# Patient Record
Sex: Male | Born: 1960 | Race: White | Hispanic: No | Smoking: Former smoker
Health system: Southern US, Academic
[De-identification: ages and names within clinical notes are randomized; demographics above are authoritative.]

## PROBLEM LIST (undated history)

## (undated) DIAGNOSIS — J449 Chronic obstructive pulmonary disease, unspecified: Secondary | ICD-10-CM

## (undated) DIAGNOSIS — E611 Iron deficiency: Secondary | ICD-10-CM

## (undated) DIAGNOSIS — E119 Type 2 diabetes mellitus without complications: Secondary | ICD-10-CM

## (undated) DIAGNOSIS — I10 Essential (primary) hypertension: Secondary | ICD-10-CM

## (undated) DIAGNOSIS — K4090 Unilateral inguinal hernia, without obstruction or gangrene, not specified as recurrent: Secondary | ICD-10-CM

## (undated) DIAGNOSIS — F32A Depression, unspecified: Secondary | ICD-10-CM

## (undated) DIAGNOSIS — D649 Anemia, unspecified: Secondary | ICD-10-CM

## (undated) DIAGNOSIS — L409 Psoriasis, unspecified: Secondary | ICD-10-CM

## (undated) DIAGNOSIS — J45909 Unspecified asthma, uncomplicated: Secondary | ICD-10-CM

## (undated) DIAGNOSIS — M5430 Sciatica, unspecified side: Secondary | ICD-10-CM

## (undated) DIAGNOSIS — F411 Generalized anxiety disorder: Secondary | ICD-10-CM

## (undated) HISTORY — PX: HX APPENDECTOMY: SHX54

## (undated) HISTORY — PX: DENTAL SURGERY: SHX609

## (undated) HISTORY — DX: Unilateral inguinal hernia, without obstruction or gangrene, not specified as recurrent: K40.90

## (undated) HISTORY — DX: Unspecified asthma, uncomplicated: J45.909

## (undated) HISTORY — DX: Anemia, unspecified: D64.9

## (undated) HISTORY — DX: Essential (primary) hypertension: I10

## (undated) HISTORY — DX: Type 2 diabetes mellitus without complications: E11.9

## (undated) HISTORY — DX: Psoriasis, unspecified: L40.9

## (undated) HISTORY — PX: HIP SURGERY: SHX245

## (undated) HISTORY — DX: Chronic obstructive pulmonary disease, unspecified: J44.9

## (undated) HISTORY — DX: Iron deficiency: E61.1

## (undated) HISTORY — DX: Generalized anxiety disorder: F41.1

## (undated) HISTORY — DX: Sciatica, unspecified side: M54.30

## (undated) HISTORY — DX: Depression, unspecified: F32.A

---

## 2011-03-04 ENCOUNTER — Other Ambulatory Visit (HOSPITAL_COMMUNITY): Payer: Self-pay | Admitting: Emergency Medicine

## 2022-02-08 ENCOUNTER — Ambulatory Visit (HOSPITAL_COMMUNITY): Payer: Self-pay | Admitting: NURSE PRACTITIONER

## 2022-02-08 NOTE — Telephone Encounter (Signed)
-----   Message from [facility] sent at 02/07/2022  4:33 PM EDT -----  New referral pt     Crystal is calling from [facility] needing to speak to someone about a referral that was sent last month, if we can please call her back.      Thank you

## 2022-02-08 NOTE — Telephone Encounter (Signed)
Called and notified that we have received the referral paperwork and will hopefully let them know this week about an appointment.     Patrcia Dolly APRN, FNP-BC, AOCNP, 02/08/2022 , 13:36

## 2022-04-10 ENCOUNTER — Ambulatory Visit (HOSPITAL_COMMUNITY): Payer: Medicaid Other | Admitting: NURSE PRACTITIONER

## 2022-06-08 ENCOUNTER — Other Ambulatory Visit: Payer: Self-pay

## 2022-06-08 ENCOUNTER — Encounter (HOSPITAL_COMMUNITY): Payer: Self-pay | Admitting: NURSE PRACTITIONER

## 2022-06-08 ENCOUNTER — Ambulatory Visit: Payer: Medicaid Other | Attending: NURSE PRACTITIONER | Admitting: NURSE PRACTITIONER

## 2022-06-08 ENCOUNTER — Ambulatory Visit (HOSPITAL_COMMUNITY): Payer: Medicaid Other

## 2022-06-08 ENCOUNTER — Other Ambulatory Visit (HOSPITAL_COMMUNITY): Payer: Self-pay | Admitting: NURSE PRACTITIONER

## 2022-06-08 VITALS — BP 131/83 | HR 128 | Temp 97.6°F | Ht 66.0 in | Wt 190.5 lb

## 2022-06-08 DIAGNOSIS — D509 Iron deficiency anemia, unspecified: Secondary | ICD-10-CM

## 2022-06-08 DIAGNOSIS — R5383 Other fatigue: Secondary | ICD-10-CM | POA: Insufficient documentation

## 2022-06-08 DIAGNOSIS — M549 Dorsalgia, unspecified: Secondary | ICD-10-CM | POA: Insufficient documentation

## 2022-06-08 DIAGNOSIS — M2550 Pain in unspecified joint: Secondary | ICD-10-CM | POA: Insufficient documentation

## 2022-06-08 DIAGNOSIS — R10811 Right upper quadrant abdominal tenderness: Secondary | ICD-10-CM | POA: Insufficient documentation

## 2022-06-08 DIAGNOSIS — R0602 Shortness of breath: Secondary | ICD-10-CM | POA: Insufficient documentation

## 2022-06-08 DIAGNOSIS — R42 Dizziness and giddiness: Secondary | ICD-10-CM | POA: Insufficient documentation

## 2022-06-08 DIAGNOSIS — F32A Depression, unspecified: Secondary | ICD-10-CM | POA: Insufficient documentation

## 2022-06-08 DIAGNOSIS — F17210 Nicotine dependence, cigarettes, uncomplicated: Secondary | ICD-10-CM | POA: Insufficient documentation

## 2022-06-08 DIAGNOSIS — R450 Nervousness: Secondary | ICD-10-CM | POA: Insufficient documentation

## 2022-06-08 LAB — COMPREHENSIVE METABOLIC PANEL, NON-FASTING
ALBUMIN/GLOBULIN RATIO: 1.6 — ABNORMAL HIGH (ref 0.8–1.4)
ALBUMIN: 4.3 g/dL (ref 3.5–5.7)
ALKALINE PHOSPHATASE: 93 U/L (ref 34–104)
ALT (SGPT): 10 U/L (ref 7–52)
ANION GAP: 8 mmol/L (ref 4–13)
AST (SGOT): 14 U/L (ref 13–39)
BILIRUBIN TOTAL: 0.4 mg/dL (ref 0.3–1.2)
BUN/CREA RATIO: 11 (ref 6–22)
BUN: 14 mg/dL (ref 7–25)
CALCIUM, CORRECTED: 9.5 mg/dL (ref 8.9–10.8)
CALCIUM: 9.7 mg/dL (ref 8.6–10.3)
CHLORIDE: 101 mmol/L (ref 98–107)
CO2 TOTAL: 28 mmol/L (ref 21–31)
CREATININE: 1.23 mg/dL (ref 0.60–1.30)
ESTIMATED GFR: 68 mL/min/{1.73_m2} (ref >59)
GLOBULIN: 2.7 — ABNORMAL LOW (ref 2.9–5.4)
GLUCOSE: 232 mg/dL — ABNORMAL HIGH (ref 74–109)
OSMOLALITY, CALCULATED: 282 mOsm/kg (ref 270–290)
POTASSIUM: 4 mmol/L (ref 3.5–5.1)
PROTEIN TOTAL: 7 g/dL (ref 6.4–8.9)
SODIUM: 137 mmol/L (ref 136–145)

## 2022-06-08 LAB — IRON TRANSFERRIN AND TIBC
IRON (TRANSFERRIN) SATURATION: 56 % — ABNORMAL HIGH (ref 20–50)
IRON: 180 ug/dL (ref 50–212)
TOTAL IRON BINDING CAPACITY: 322 ug/dL (ref 250–450)
TRANSFERRIN: 230 mg/dL (ref 203–362)
UIBC: 142 ug/dL (ref 130–375)

## 2022-06-08 LAB — FERRITIN: FERRITIN: 52 ng/mL (ref 11–336)

## 2022-06-08 LAB — CBC WITH DIFF
BASOPHIL #: 0.1 10*3/uL (ref 0.00–0.10)
BASOPHIL %: 1 % (ref 0–1)
EOSINOPHIL #: 0.8 10*3/uL — ABNORMAL HIGH (ref 0.00–0.50)
EOSINOPHIL %: 8 %
HCT: 42.7 % (ref 36.7–47.1)
HGB: 13.7 g/dL (ref 12.5–16.3)
LYMPHOCYTE #: 1.9 10*3/uL (ref 1.00–3.00)
LYMPHOCYTE %: 18 % (ref 16–44)
MCH: 24.9 pg (ref 23.8–33.4)
MCHC: 32 g/dL — ABNORMAL LOW (ref 32.5–36.3)
MCV: 77.6 fL (ref 73.0–96.2)
MONOCYTE #: 0.8 10*3/uL (ref 0.30–1.00)
MONOCYTE %: 7 % (ref 5–13)
MPV: 7.1 fL — ABNORMAL LOW (ref 7.4–11.4)
NEUTROPHIL #: 6.9 10*3/uL (ref 1.85–7.80)
NEUTROPHIL %: 66 % (ref 43–77)
PLATELETS: 288 10*3/uL (ref 140–440)
RBC: 5.51 10*6/uL (ref 4.06–5.63)
RDW: 15.6 % (ref 12.1–16.2)
WBC: 10.6 10*3/uL — ABNORMAL HIGH (ref 3.6–10.2)

## 2022-06-08 NOTE — H&P (Signed)
Department of Hematology/Oncology  History and Physical    Name: Nathan Hopkins  [id number]  Date of Birth: 22-Aug-1960  Encounter Date: 06/08/2022    REFERRING PROVIDER:  Hul[place] [address]    REASON FOR OFFICE VISIT:  New patient for evaluation and management of  iron deficiency anemia    HISTORY OF PRESENT ILLNESS:  Nathan Hopkins is a 62 y.o. male who presents today for iron deficiency anemia. He complains mostly of fatigue. Denies any PICA. He has been taking oral iron daily for several months without much response (See ROS for additional complaints)    ROS:   Review of Systems   Constitutional:  Positive for fatigue. Negative for appetite change.   HENT:  Negative.  Negative for trouble swallowing.    Eyes: Negative.    Respiratory:  Positive for shortness of breath (smokes 1 PPD but trying to quit).    Cardiovascular: Negative.  Negative for chest pain.   Gastrointestinal: Negative.  Negative for abdominal pain, constipation, diarrhea, nausea and vomiting.   Genitourinary: Negative.  Negative for difficulty urinating.    Musculoskeletal:  Positive for arthralgias and back pain.   Skin: Negative.    Neurological:  Positive for light-headedness (when standing to fast).   Hematological:  Bruises/bleeds easily.   Psychiatric/Behavioral:  Positive for depression. The patient is nervous/anxious.         History:  Past Medical History:   Diagnosis Date    Anemia     Anxiety state     COPD (chronic obstructive pulmonary disease) (CMS HCC)     Depression     Diabetes mellitus, type 2 (CMS HCC)     Essential hypertension     Inguinal hernia     Iron deficiency     Psoriasis and similar disorders     ears and back of head           Past Surgical History:   Procedure Laterality Date    DENTAL SURGERY      HIP SURGERY      artifical hip    HX APPENDECTOMY             Social History     Socioeconomic History    Marital status: Divorced     Spouse name: Not on file     Number of children: Not on file    Years of education: Not on file    Highest education level: Not on file   Occupational History    Not on file   Tobacco Use    Smoking status: Every Day     Types: Cigarettes    Smokeless tobacco: Never   Vaping Use    Vaping Use: Former   Substance and Sexual Activity    Alcohol use: Yes    Drug use: Never    Sexual activity: Not on file   Other Topics Concern    Not on file   Social History Narrative    Not on file     Social Determinants of Health     Financial Resource Strain: Not on file   Transportation Needs: Not on file   Social Connections: Not on file   Intimate Partner Violence: Not on file   Housing Stability: Not on file       Social History     Social History Narrative    Not on file       Social History  Substance and Sexual Activity   Drug Use Never       Family Medical History:       Problem Relation (Age of Onset)    Alzheimer's/Dementia Mother    Lung Cancer Father              Current Outpatient Medications   Medication Sig    ACCU-CHEK GUIDE TEST STRIPS Does not apply Strip     ACCU-CHEK SOFTCLIX LANCETS Misc Use to test blood sugar fingerstick Three times a day fingerstick Three times a day for 90 days    albuterol sulfate (PROVENTIL OR VENTOLIN OR PROAIR) 90 mcg/actuation Inhalation oral inhaler Take 1 Puff by inhalation    albuterol sulfate (PROVENTIL OR VENTOLIN OR PROAIR) 90 mcg/actuation Inhalation oral inhaler 1 puff as needed Inhalation every 4 hrs for 90 days    ANORO ELLIPTA 62.5-25 mcg/actuation Inhalation oral diskus inhaler     Benzonatate (TESSALON) 200 mg Oral Capsule 1 Capsule (200 mg total)    busPIRone (BUSPAR) 15 mg Oral Tablet     celecoxib (CELEBREX) 200 mg Oral Capsule 1 capsule with food Orally 2 times a day for 90 days    clonazePAM (KLONOPIN) 1 mg Oral Tablet 1 Tablet (1 mg total)    CYMBALTA 60 mg Oral Capsule, Delayed Release(E.C.) 2 cap(s) orally once a day    hydrOXYzine pamoate (VISTARIL) 100 mg Oral Capsule as directed 3 times  a day Orally three times a day    ipratropium-albuteroL (COMBIVENT RESPIMAT) 20-100 mcg/actuation Inhalation Mist Take 1 Puff by inhalation    ipratropium-albuterol 0.5 mg-3 mg(2.5 mg base)/3 mL Solution for Nebulization 3 ml by nebulizer 4 times a day for 90 days    Levocetirizine (XYZAL) 5 mg Oral Tablet 1 tablet in the evening Orally Once a day for 90 days    loratadine (CLARITIN) 10 mg Oral Tablet Take 1 Tablet (10 mg total) by mouth    MetFORMIN (GLUCOPHAGE) 1,000 mg Oral Tablet 1 tablet with a meal Orally 2 times a day for 90 days    Mirtazapine (REMERON) 45 mg Oral Tablet     montelukast (SINGULAIR) 10 mg Oral Tablet 1 Tablet (10 mg total)    nicotine (NICODERM CQ) 21 mg/24 hr Transdermal Patch 24 hr Place 1 Patch (21 mg total) on the skin    polysaccharide iron complex (FERREX 150) 150 mg iron Oral Capsule     prazosin (MINIPRESS) 2 mg Oral Capsule     PROTONIX 40 mg Oral Tablet, Delayed Release (E.C.) 1 tab(s) orally once a day for 90 days    rOPINIRole (REQUIP) 1 mg Oral Tablet 1 tab(s) orally 2 times a day for 90 days    SYMBICORT 160-4.5 mcg/actuation Inhalation oral inhaler 2 puffs Inhalation Twice a day for 90 days    tamsulosin (FLOMAX) 0.4 mg Oral Capsule 1 cap(s) orally once a day for 90 days    umeclidinium-vilanteroL (ANORO ELLIPTA) 62.5-25 mcg/actuation Inhalation oral diskus inhaler Take by inhalation Once a day    varenicline (CHANTIX) dose pak        No Known Allergies      PHYSICAL EXAM:  BP 131/83 (Site: Left, Patient Position: Sitting, Cuff Size: Adult)   Pulse (!) 128   Temp 36.4 C (97.6 F) (Temporal)   Ht 1.676 m (5' 6"$ )   Wt 86.4 kg (190 lb 8 oz)   BMI 30.75 kg/m        ECOG Status: (1) Restricted in physically strenuous activity, ambulatory and  able to do work of light nature   Physical Exam  Vitals and nursing note reviewed.   Constitutional:       Appearance: Normal appearance.   HENT:      Head: Normocephalic.      Nose: Nose normal.      Mouth/Throat:      Mouth: Mucous  membranes are moist.      Pharynx: Oropharynx is clear.   Eyes:      General: No scleral icterus.     Extraocular Movements: Extraocular movements intact.   Cardiovascular:      Rate and Rhythm: Normal rate and regular rhythm.      Pulses: Normal pulses.      Heart sounds: Normal heart sounds.   Pulmonary:      Effort: Pulmonary effort is normal.      Breath sounds: Normal breath sounds.   Abdominal:      General: Bowel sounds are normal.      Palpations: Abdomen is soft.      Tenderness: There is abdominal tenderness (RUQ with palpation).   Musculoskeletal:         General: Normal range of motion.      Cervical back: Normal range of motion and neck supple.   Skin:     General: Skin is warm and dry.   Neurological:      General: No focal deficit present.      Mental Status: He is alert and oriented to person, place, and time. Mental status is at baseline.   Psychiatric:         Mood and Affect: Mood normal.         Behavior: Behavior normal.         Thought Content: Thought content normal.         Judgment: Judgment normal.       LABS:                             ASSESSMENT:    ICD-10-CM    1. Iron deficiency anemia  D50.9 COMPREHENSIVE METABOLIC PANEL, NON-FASTING     CBC/DIFF     FERRITIN     IRON TRANSFERRIN AND TIBC             PLAN:   1. All relative external and internal medical records were reviewed including available H&Ps, progress notes, procedure notes, imaging's, laboratories, and pathology.   2. Referral and all pertinent labs reviewed. Details of exam finding's discussed.   3. Patient will have labs completed as ordered. IV iron will be ordered for patient. We discussed the process of obtaining insurance authorization and he will be notified once he is scheduled a time and date to return for treatment once this process is completed. The patient is agreeable to this plan.       Vondell Kisler was given the chance to ask questions, and these were answered to their satisfaction. The patient is welcome  to call with any questions or concerns in the meantime.     On the day of the encounter, a total of  54 minutes was spent on this patient encounter including review of historical information, examination, documentation and post-visit activities.   Return in about 6 weeks (around 07/20/2022).     Kathrin Penner, FNP-BC  06/08/2022, 10:28    CC:  Hulan Saas, APRN  [place] [address]    Newsome-Deel, Joellen [place] [address]      This note was partially generated using MModal Fluency Direct system, and there may be some incorrect words, spellings, and punctuation that were not noted in checking the note before saving.

## 2022-07-02 NOTE — ED Triage Notes (Signed)
Patient to ER with c/o cough, dizziness and SOB. Patient states that he is always SOB but that today it was worse. Patient states sat was 80% at home on room air. Patient is suppose to wear o2 at home.

## 2022-07-19 ENCOUNTER — Ambulatory Visit (HOSPITAL_COMMUNITY): Payer: Self-pay | Admitting: NURSE PRACTITIONER

## 2022-07-26 ENCOUNTER — Ambulatory Visit: Payer: Medicaid Other | Attending: NURSE PRACTITIONER | Admitting: NURSE PRACTITIONER

## 2022-07-26 ENCOUNTER — Encounter (HOSPITAL_COMMUNITY): Payer: Self-pay | Admitting: NURSE PRACTITIONER

## 2022-07-26 ENCOUNTER — Other Ambulatory Visit: Payer: Self-pay

## 2022-07-26 VITALS — BP 133/73 | HR 123 | Temp 98.0°F | Wt 194.1 lb

## 2022-07-26 DIAGNOSIS — R5383 Other fatigue: Secondary | ICD-10-CM | POA: Insufficient documentation

## 2022-07-26 DIAGNOSIS — D509 Iron deficiency anemia, unspecified: Secondary | ICD-10-CM | POA: Insufficient documentation

## 2022-07-26 DIAGNOSIS — F17200 Nicotine dependence, unspecified, uncomplicated: Secondary | ICD-10-CM | POA: Insufficient documentation

## 2022-07-26 DIAGNOSIS — J45909 Unspecified asthma, uncomplicated: Secondary | ICD-10-CM | POA: Insufficient documentation

## 2022-07-26 NOTE — Cancer Center Note (Signed)
Department of Hematology/Oncology  History and Physical    Name: Emigdio Duty  IOX:[id number]  Date of Birth: April 04, 1961  Encounter Date: 07/26/2022    REFERRING PROVIDER:  Jeri Modena, APRN  [address]  [place],  [place] [address]    REASON FOR OFFICE VISIT:  Follow up for evaluation and management of  iron deficiency anemia    HISTORY OF PRESENT ILLNESS:  Connie Bison is a 62 y.o. male who presents today for iron deficiency anemia. He complains mostly of fatigue. Denies any PICA. He has been taking oral iron daily for several months without much response.     07/26/22: We discussed that his lab work at his last visit showed his iron back to normal limits. We discussed that we could let him follow up with his PCP for his labs and he could return on an as needed basis since the oral iron is working. He states that he does have breathing issues related to his asthma and smoking history and is still working on quitting.     ROS:   Review of Systems   Constitutional:  Positive for fatigue. Negative for appetite change.   HENT:  Negative.  Negative for trouble swallowing.    Eyes: Negative.    Respiratory:  Positive for shortness of breath (smokes 1 PPD but trying to quit).    Cardiovascular: Negative.  Negative for chest pain.   Gastrointestinal: Negative.  Negative for abdominal pain, constipation, diarrhea, nausea and vomiting.   Genitourinary: Negative.  Negative for difficulty urinating.    Musculoskeletal:  Positive for arthralgias and back pain.   Skin: Negative.    Neurological:  Positive for light-headedness (when standing to fast).   Hematological:  Bruises/bleeds easily.   Psychiatric/Behavioral:  Positive for depression. The patient is nervous/anxious.         History:  Past Medical History:   Diagnosis Date    Anemia     Anxiety state     COPD (chronic obstructive pulmonary disease) (CMS HCC)     Depression     Diabetes mellitus, type 2 (CMS HCC)     Essential hypertension     Inguinal hernia      Iron deficiency     Psoriasis and similar disorders     ears and back of head     Past Surgical History:   Procedure Laterality Date    DENTAL SURGERY      HIP SURGERY      artifical hip    HX APPENDECTOMY       Social History     Socioeconomic History    Marital status: Divorced     Spouse name: Not on file    Number of children: Not on file    Years of education: Not on file    Highest education level: Not on file   Occupational History    Not on file   Tobacco Use    Smoking status: Every Day     Types: Cigarettes    Smokeless tobacco: Never   Vaping Use    Vaping status: Former   Substance and Sexual Activity    Alcohol use: Yes    Drug use: Never    Sexual activity: Not on file   Other Topics Concern    Not on file   Social History Narrative    Not on file     Social Determinants of Health     Financial Resource Strain: Not on file  Transportation Needs: Not on file   Social Connections: Not on file   Intimate Partner Violence: Not on file   Housing Stability: Not on file     Social History     Social History Narrative    Not on file       Social History     Substance and Sexual Activity   Drug Use Never       Family Medical History:       Problem Relation (Age of Onset)    Alzheimer's/Dementia Mother    Lung Cancer Father          Current Outpatient Medications   Medication Sig    ACCU-CHEK GUIDE TEST STRIPS Does not apply Strip     ACCU-CHEK SOFTCLIX LANCETS Misc Use to test blood sugar fingerstick Three times a day fingerstick Three times a day for 90 days    albuterol sulfate (PROVENTIL OR VENTOLIN OR PROAIR) 90 mcg/actuation Inhalation oral inhaler Take 1 Puff by inhalation    albuterol sulfate (PROVENTIL OR VENTOLIN OR PROAIR) 90 mcg/actuation Inhalation oral inhaler 1 puff as needed Inhalation every 4 hrs for 90 days    ANORO ELLIPTA 62.5-25 mcg/actuation Inhalation oral diskus inhaler     Benzonatate (TESSALON) 200 mg Oral Capsule 1 Capsule (200 mg total)    busPIRone (BUSPAR) 15 mg Oral Tablet      celecoxib (CELEBREX) 200 mg Oral Capsule 1 capsule with food Orally 2 times a day for 90 days    clonazePAM (KLONOPIN) 1 mg Oral Tablet 1 Tablet (1 mg total)    CYMBALTA 60 mg Oral Capsule, Delayed Release(E.C.) 2 cap(s) orally once a day    hydrOXYzine pamoate (VISTARIL) 100 mg Oral Capsule as directed 3 times a day Orally three times a day    ipratropium-albuteroL (COMBIVENT RESPIMAT) 20-100 mcg/actuation Inhalation Mist Take 1 Puff by inhalation    ipratropium-albuterol 0.5 mg-3 mg(2.5 mg base)/3 mL Solution for Nebulization 3 ml by nebulizer 4 times a day for 90 days    Levocetirizine (XYZAL) 5 mg Oral Tablet 1 tablet in the evening Orally Once a day for 90 days    loratadine (CLARITIN) 10 mg Oral Tablet Take 1 Tablet (10 mg total) by mouth    MetFORMIN (GLUCOPHAGE) 1,000 mg Oral Tablet 1 tablet with a meal Orally 2 times a day for 90 days    Mirtazapine (REMERON) 45 mg Oral Tablet     montelukast (SINGULAIR) 10 mg Oral Tablet 1 Tablet (10 mg total)    nicotine (NICODERM CQ) 21 mg/24 hr Transdermal Patch 24 hr Place 1 Patch (21 mg total) on the skin    polysaccharide iron complex (FERREX 150) 150 mg iron Oral Capsule     prazosin (MINIPRESS) 2 mg Oral Capsule     PROTONIX 40 mg Oral Tablet, Delayed Release (E.C.) 1 tab(s) orally once a day for 90 days    rOPINIRole (REQUIP) 1 mg Oral Tablet 1 tab(s) orally 2 times a day for 90 days    SYMBICORT 160-4.5 mcg/actuation Inhalation oral inhaler 2 puffs Inhalation Twice a day for 90 days    tamsulosin (FLOMAX) 0.4 mg Oral Capsule 1 cap(s) orally once a day for 90 days    umeclidinium-vilanteroL (ANORO ELLIPTA) 62.5-25 mcg/actuation Inhalation oral diskus inhaler Take by inhalation Once a day    varenicline (CHANTIX) dose pak        No Known Allergies      PHYSICAL EXAM:  BP 133/73 (Site: Left, Patient Position: Sitting,  Cuff Size: Adult)   Pulse (!) 123   Temp 36.7 C (98 F) (Thermal Scan)   Wt 88 kg (194 lb 1.6 oz)   SpO2 96%   BMI 31.33 kg/m        ECOG  Status: (1) Restricted in physically strenuous activity, ambulatory and able to do work of light nature   Physical Exam  Vitals and nursing note reviewed.   Constitutional:       Appearance: Normal appearance.   HENT:      Head: Normocephalic.      Nose: Nose normal.      Mouth/Throat:      Mouth: Mucous membranes are moist.      Pharynx: Oropharynx is clear.   Eyes:      General: No scleral icterus.     Extraocular Movements: Extraocular movements intact.   Cardiovascular:      Rate and Rhythm: Normal rate and regular rhythm.      Pulses: Normal pulses.      Heart sounds: Normal heart sounds.   Pulmonary:      Effort: Pulmonary effort is normal.      Breath sounds: Normal breath sounds.   Abdominal:      General: Bowel sounds are normal.      Palpations: Abdomen is soft.      Tenderness: There is abdominal tenderness (RUQ with palpation).   Musculoskeletal:         General: Normal range of motion.      Cervical back: Normal range of motion and neck supple.   Skin:     General: Skin is warm and dry.   Neurological:      General: No focal deficit present.      Mental Status: He is alert and oriented to person, place, and time. Mental status is at baseline.   Psychiatric:         Mood and Affect: Mood normal.         Behavior: Behavior normal.         Thought Content: Thought content normal.         Judgment: Judgment normal.       LABS:     CBC  Diff   Lab Results   Component Value Date/Time    WBC 10.6 (H) 06/08/2022 11:07 AM    HGB 13.7 06/08/2022 11:07 AM    HCT 42.7 06/08/2022 11:07 AM    PLTCNT 288 06/08/2022 11:07 AM    RBC 5.51 06/08/2022 11:07 AM    MCV 77.6 06/08/2022 11:07 AM    MCHC 32.0 (L) 06/08/2022 11:07 AM    MCH 24.9 06/08/2022 11:07 AM    RDW 15.6 06/08/2022 11:07 AM    MPV 7.1 (L) 06/08/2022 11:07 AM    Lab Results   Component Value Date/Time    PMNS 66 06/08/2022 11:07 AM    LYMPHOCYTES 18 06/08/2022 11:07 AM    EOSINOPHIL 8 06/08/2022 11:07 AM    MONOCYTES 7 06/08/2022 11:07 AM    BASOPHILS 1  06/08/2022 11:07 AM    BASOPHILS 0.10 06/08/2022 11:07 AM    PMNABS 6.90 06/08/2022 11:07 AM    LYMPHSABS 1.90 06/08/2022 11:07 AM    EOSABS 0.80 (H) 06/08/2022 11:07 AM    MONOSABS 0.80 06/08/2022 11:07 AM            Comprehensive Metabolic Profile    Lab Results   Component Value Date    SODIUM 137 06/08/2022    POTASSIUM 4.0  06/08/2022    CHLORIDE 101 06/08/2022    CO2 28 06/08/2022    ANIONGAP 8 06/08/2022    BUN 14 06/08/2022    CREATININE 1.23 06/08/2022    ALBUMIN 4.3 06/08/2022    CALCIUM 9.7 06/08/2022    GLUCOSENF 232 (H) 06/08/2022    ALKPHOS 93 06/08/2022    ALT 10 06/08/2022    AST 14 06/08/2022    TOTBILIRUBIN 0.4 06/08/2022    TOTALPROTEIN 7.0 06/08/2022       IRON   Date Value Ref Range Status   06/08/2022 180 50 - 212 ug/dL Final     FERRITIN   Date Value Ref Range Status   06/08/2022 52 11 - 336 ng/mL Final     TOTAL IRON BINDING CAPACITY   Date Value Ref Range Status   06/08/2022 322 250 - 450 ug/dL Final     UIBC   Date Value Ref Range Status   06/08/2022 142 130 - 375 ug/dL Final     IRON (TRANSFERRIN) SATURATION   Date Value Ref Range Status   06/08/2022 56 (H) 20 - 50 % Final       ASSESSMENT:    ICD-10-CM    1. Iron deficiency anemia  D50.9            PLAN:   He will continue his current medications as ordered including his oral iron. He will follow up with his PCP as scheduled and continue to have his labs monitored with them. He will notify our office if he would like to return for follow up or his PCP would like him to return. He is agreeable to this plan.     Kadeem Radene KneeStilwell was given the chance to ask questions, and these were answered to their satisfaction. The patient is welcome to call with any questions or concerns in the meantime.     On the day of the encounter, a total of  36 minutes was spent on this patient encounter including review of historical information, examination, documentation and post-visit activities.   Return if symptoms worsen or fail to improve.     Benjaman PottMelissa  Alyra Patty, APRN,FNP-BC ,07/26/2022 ,15:43     CC:  Jeri ModenaKatie Newsome-Deel, APRN  [address]  [place] [id number]    Newsome-Deel, Katie, APRN  [address]  [place] [phone]      This note was partially generated using MModal Fluency Direct system, and there may be some incorrect words, spellings, and punctuation that were not noted in checking the note before saving.

## 2023-05-09 IMAGING — MR MRI BRAIN W/O CONTRAST
9 series · 48 of 48 positions shown · non-contrast
Comparison: None available.

﻿EXAM:  MRI BRAIN W/O CONTRAST
INDICATION: 62-year-old male with history of dizziness and disorientation. No history of trauma, malignancy.
TECHNIQUE: Multiplanar, multisequential MRI of the brain was performed without administration of IV contrast.

[Series 5: DWI · axial · 6.5mm · 1.35mm/px · z∈[-101,+56]mm · 16 of 88 slices shown (1 of 3)]
[im 1/88]
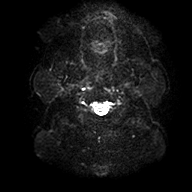
[im 6/88]
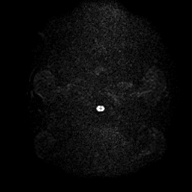
[im 12/88]
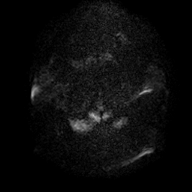
[im 18/88]
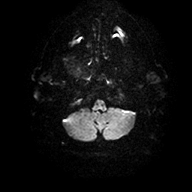
[im 24/88]
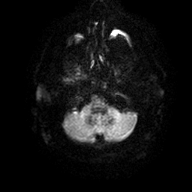
[im 30/88]
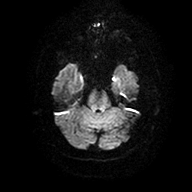
[im 35/88]
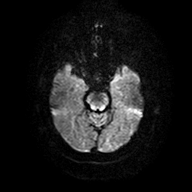
[im 41/88]
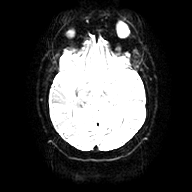
[im 47/88]
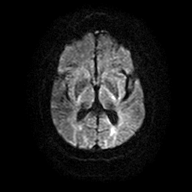
[im 53/88]
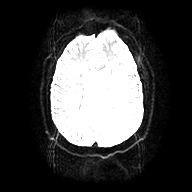
[im 59/88]
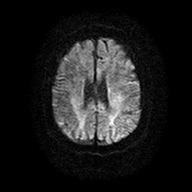
[im 64/88]
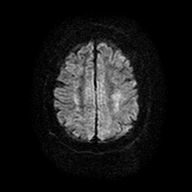
[im 70/88]
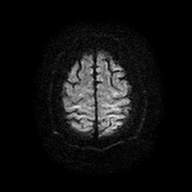
[im 76/88]
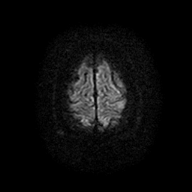
[im 82/88]
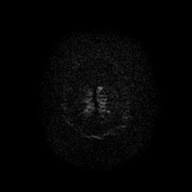
[im 88/88]
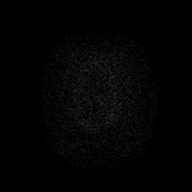

[Series 6: DWI · axial · 6.5mm · 1.35mm/px · z∈[-101,+56]mm · 4 of 22 slices shown (2 of 3)]
[im 1/22]
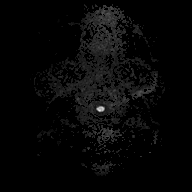
[im 8/22]
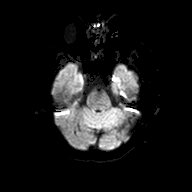
[im 15/22]
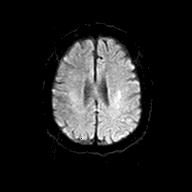
[im 22/22]
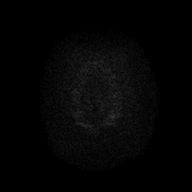

[Series 7: DWI · axial · 6.5mm · 1.35mm/px · z∈[-101,+56]mm · 4 of 22 slices shown (3 of 3)]
[im 1/22]
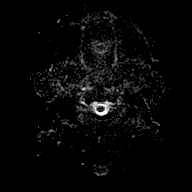
[im 8/22]
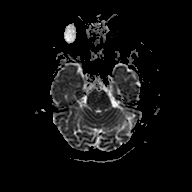
[im 15/22]
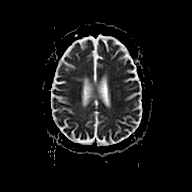
[im 22/22]
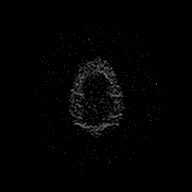

[Series 8: FLAIR · sagittal · 5.0mm · 0.75mm/px · 4 of 26 slices shown (1 of 2)]
[im 1/26]
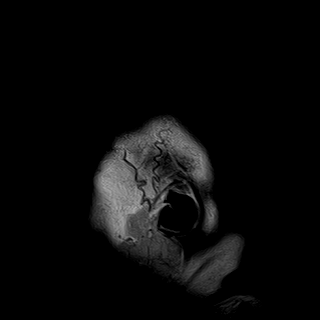
[im 9/26]
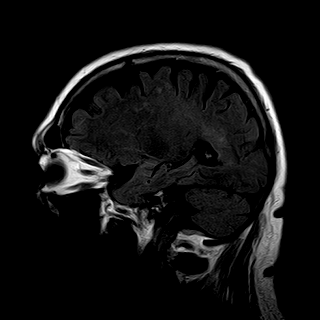
[im 17/26]
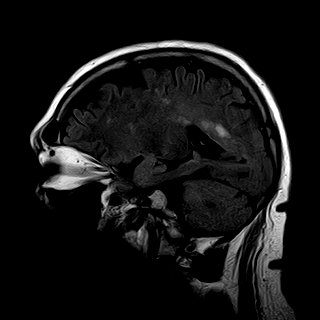
[im 26/26]
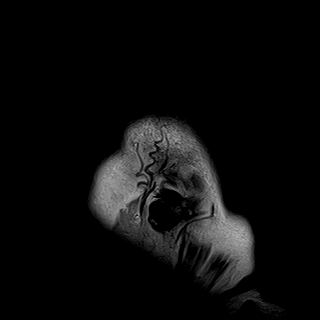

[Series 9: T2 · axial · 5.5mm · 0.43mm/px · z∈[-97,+59]mm · 4 of 25 slices shown (1 of 2)]
[im 1/25]
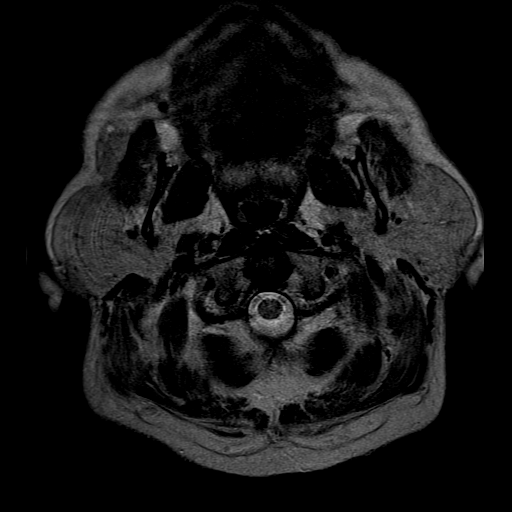
[im 9/25]
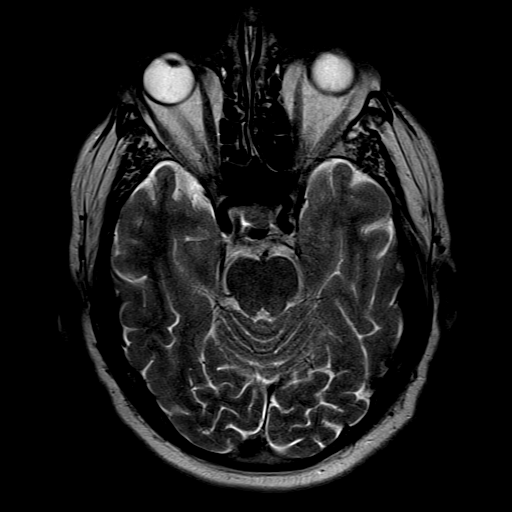
[im 17/25]
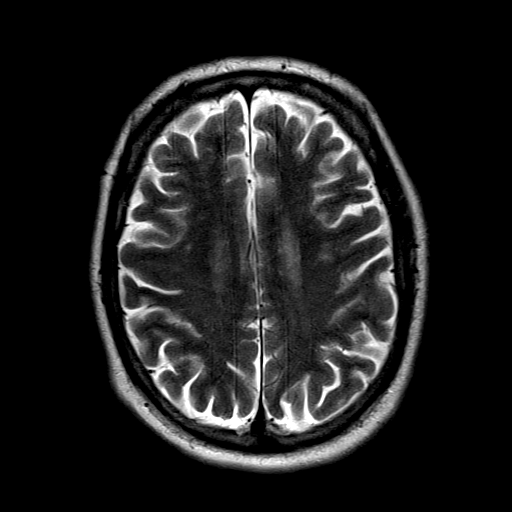
[im 25/25]
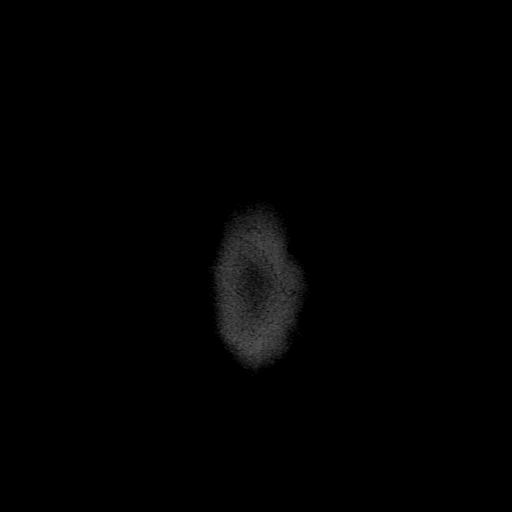

[Series 10: T2 · coronal · 6.5mm · 0.43mm/px · 4 of 26 slices shown (2 of 2)]
[im 1/26]
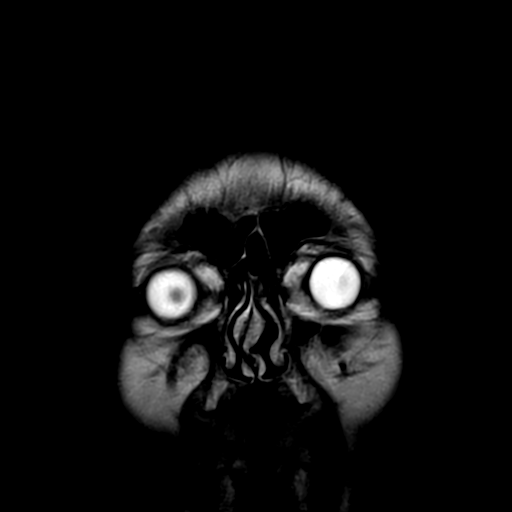
[im 9/26]
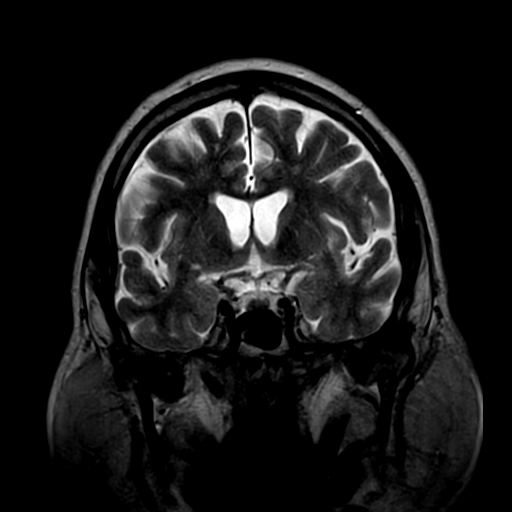
[im 17/26]
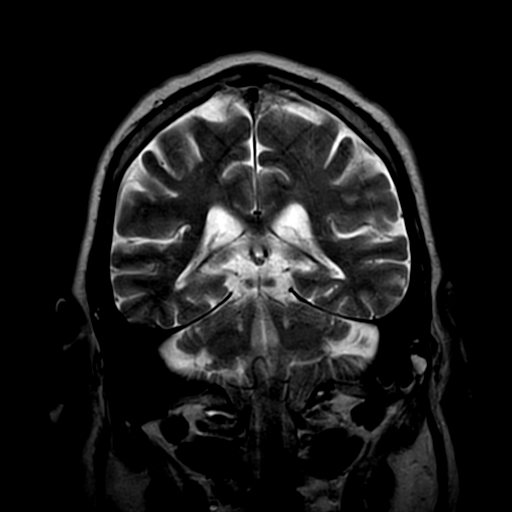
[im 26/26]
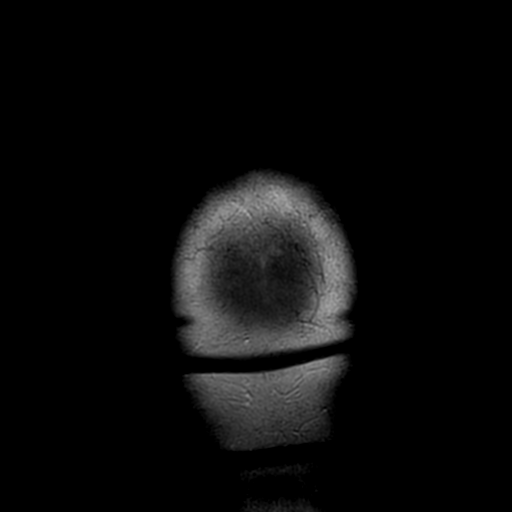

[Series 11: FLAIR · axial · 5.5mm · 0.76mm/px · z∈[-97,+65]mm · 4 of 26 slices shown (2 of 2)]
[im 1/26]
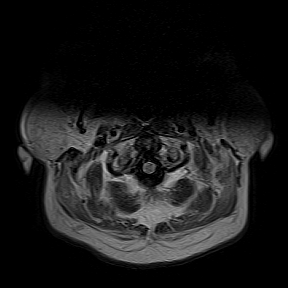
[im 9/26]
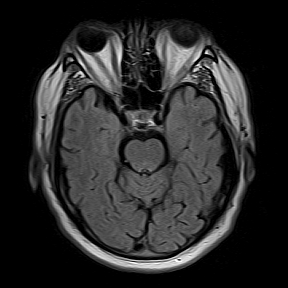
[im 17/26]
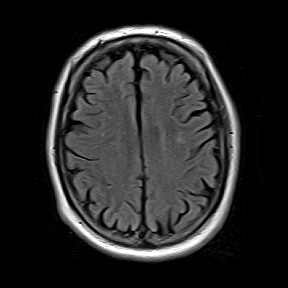
[im 26/26]
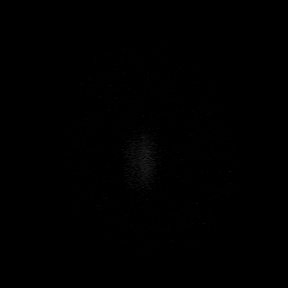

[Series 12: T1 · axial · 5.5mm · 0.69mm/px · z∈[-94,+62]mm · 4 of 25 slices shown]
[im 1/25]
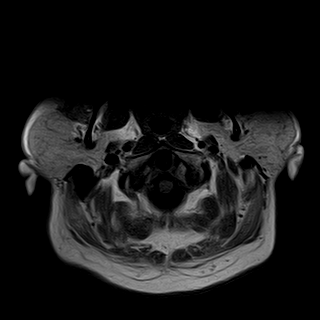
[im 9/25]
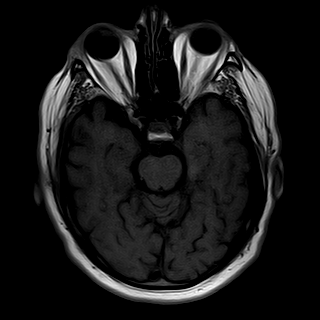
[im 17/25]
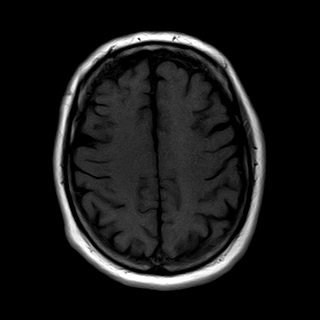
[im 25/25]
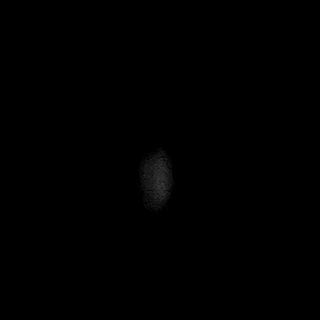

[Series 13: T2-star · axial · 5.5mm · 0.69mm/px · z∈[-94,+62]mm · 4 of 25 slices shown]
[im 1/25]
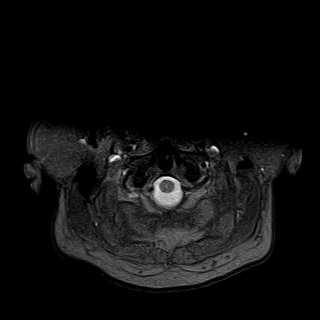
[im 9/25]
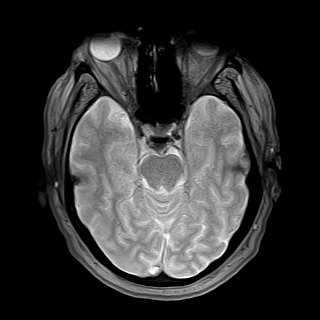
[im 17/25]
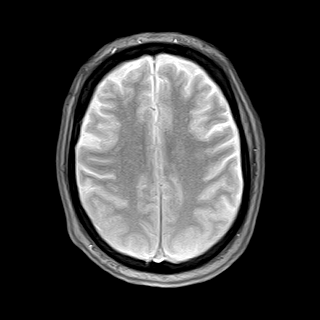
[im 25/25]
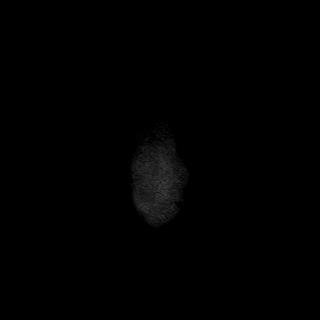

[48 of 48 positions shown; findings below may reference images not displayed]

FINDINGS: No focal areas of restricted diffusion.  No evidence of intracranial bleed or extra-axial collections.  No evidence of ventriculomegaly or midline shift.  

Multiple FLAIR bright lesions of subcortical and periventricular white matter predominantly in the parieto-occipital location and left side more than the right side. These lesions are not in the typical orientation of multiple sclerosis.  No abnormal signal of the corpus callosum.  No FLAIR signal lesions of brainstem and cerebellum.  These lesions are most likely chronic small-vessel ischemic change rather than multiple sclerosis.

Major arteries of circle of Willis are patent as well as dural venous sinuses.
IMPRESSION: 1. No acute ischemic change on diffusion sequence.  No intracranial space-occupying lesions or ventriculomegaly or midline shift. 

2. FLAIR signal lesions as described above predominantly in the left parieto-occipital location and fewer on the right side as described above.  Possible chronic small-vessel ischemic change.  Demyelinating process is not entirely ruled out. Clinical correlation is needed.

3. Major vascular structures of circle of Willis are patent.

## 2023-07-18 ENCOUNTER — Ambulatory Visit (INDEPENDENT_AMBULATORY_CARE_PROVIDER_SITE_OTHER): Payer: Self-pay | Admitting: NEUROLOGY

## 2023-07-18 NOTE — Progress Notes (Deleted)
 NEUROLOGY, [facility]  [address]  [place] [address]      ASSESSMENT/PLAN  No diagnosis found.: ***    Thank you for allowing me to participate in your patient's care and please do not hesitate to contact me for any questions or concerns.    {Time (Optional):52090}    [name], MD  Assistant Professor of Neurology  [place]  [facility]    =====================================================================    NAME:  Nathan Hopkins  DOB:  [birth year]  VISIT DATE:  07/18/2023     CC:  ***    Patient seen in consultation at the request of No ref. provider found ***  History obtained from the patient and chart/records  Age of patient:  63 y.o.    I had the pleasure of seeing Nathan Hopkins for outpatient consultation, who is a 63 y.o. year old male and is being seen for management of above CC.    HPI:    ***    =====================================================================  PMHx  Patient Active Problem List   Diagnosis    Iron deficiency anemia     Past Surgical History:   Procedure Laterality Date    DENTAL SURGERY      HIP SURGERY      artifical hip    HX APPENDECTOMY           Family Medical History:       Problem Relation (Age of Onset)    Alzheimer's/Dementia Mother    Lung Cancer Father            Current Outpatient Medications   Medication Sig Dispense Refill    ACCU-CHEK GUIDE TEST STRIPS Does not apply Strip       ACCU-CHEK SOFTCLIX LANCETS Misc Use to test blood sugar fingerstick Three times a day fingerstick Three times a day for 90 days      albuterol sulfate (PROVENTIL OR VENTOLIN OR PROAIR) 90 mcg/actuation Inhalation oral inhaler Take 1 Puff by inhalation      albuterol sulfate (PROVENTIL OR VENTOLIN OR PROAIR) 90 mcg/actuation Inhalation oral inhaler 1 puff as needed Inhalation every 4 hrs for 90 days      ANORO ELLIPTA 62.5-25 mcg/actuation Inhalation oral diskus inhaler       Benzonatate (TESSALON) 200 mg Oral Capsule 1 Capsule  (200 mg total)      busPIRone (BUSPAR) 15 mg Oral Tablet       celecoxib (CELEBREX) 200 mg Oral Capsule 1 capsule with food Orally 2 times a day for 90 days      clonazePAM (KLONOPIN) 1 mg Oral Tablet 1 Tablet (1 mg total)      CYMBALTA 60 mg Oral Capsule, Delayed Release(E.C.) 2 cap(s) orally once a day      hydrOXYzine pamoate (VISTARIL) 100 mg Oral Capsule as directed 3 times a day Orally three times a day      ipratropium-albuteroL (COMBIVENT RESPIMAT) 20-100 mcg/actuation Inhalation Mist Take 1 Puff by inhalation      ipratropium-albuterol 0.5 mg-3 mg(2.5 mg base)/3 mL Solution for Nebulization 3 ml by nebulizer 4 times a day for 90 days      Levocetirizine (XYZAL) 5 mg Oral Tablet 1 tablet in the evening Orally Once a day for 90 days      loratadine (CLARITIN) 10 mg Oral Tablet Take 1 Tablet (10 mg total) by mouth      MetFORMIN (GLUCOPHAGE) 1,000 mg Oral Tablet 1 tablet with a meal Orally 2 times a day  for 90 days      Mirtazapine (REMERON) 45 mg Oral Tablet       montelukast (SINGULAIR) 10 mg Oral Tablet 1 Tablet (10 mg total)      nicotine  (NICODERM CQ ) 21 mg/24 hr Transdermal Patch 24 hr Place 1 Patch (21 mg total) on the skin      polysaccharide iron complex (FERREX 150) 150 mg iron Oral Capsule       prazosin (MINIPRESS) 2 mg Oral Capsule       PROTONIX 40 mg Oral Tablet, Delayed Release (E.C.) 1 tab(s) orally once a day for 90 days      rOPINIRole (REQUIP) 1 mg Oral Tablet 1 tab(s) orally 2 times a day for 90 days      SYMBICORT 160-4.5 mcg/actuation Inhalation oral inhaler 2 puffs Inhalation Twice a day for 90 days      tamsulosin (FLOMAX) 0.4 mg Oral Capsule 1 cap(s) orally once a day for 90 days      umeclidinium-vilanteroL (ANORO ELLIPTA) 62.5-25 mcg/actuation Inhalation oral diskus inhaler Take by inhalation Once a day      varenicline (CHANTIX) dose pak        No current facility-administered medications for this visit.     No Known Allergies  Social History     Socioeconomic History    Marital  status: Divorced     Spouse name: Not on file    Number of children: Not on file    Years of education: Not on file    Highest education level: Not on file   Occupational History    Not on file   Tobacco Use    Smoking status: Every Day     Types: Cigarettes    Smokeless tobacco: Never   Vaping Use    Vaping status: Former   Substance and Sexual Activity    Alcohol use: Yes    Drug use: Never    Sexual activity: Not on file   Other Topics Concern    Not on file   Social History Narrative    Not on file     Social Determinants of Health     Financial Resource Strain: Not on file   Transportation Needs: Not on file   Social Connections: Not on file   Intimate Partner Violence: Not on file   Housing Stability: Not on file       =====================================================================  GENERAL EXAMINATION  There were no vitals taken for this visit.      Vital signs personally reviewed    General: No acute distress, alert  HEENT: Normocephalic, no scleral icterus  Pulmonary: No accessory muscle use, no tachypnea  Cardiovascular: Heart with regular rate & rhythm  Extremities: No significant edema, No cyanosis    NEUROLOGIC EXAM  MENTAL STATUS: alert and oriented to ***; speech clear and fluent with good repetition and comprehension; naming intact; attention/concentration normal; recent and remote memory intact with normal fund of knowledge; able to follow simple and complex axial and appendicular commands without L/R confusion.    CN  II: not directly tested, grossly intact  III, IV, VI: extraocular movements intact without nystagmus  V: intact to light touch  VII: face symmetric without weakness  VIII: grossly intact  IX, X: symmetric palatal elevation  XI: normal strength of trapezius and sternocleidomastoid bilaterally  XII: tongue midline with full movements    MOTOR  Bulk: normal  Tone: normal  Abnormal Movements: none  Fasciculations: none    Strength: Patient  moving all extremities symmetrically and  against gravity with good strength.    Reflexes: ***    Sensory: Intact to Light Touch     Coordination: ***    Gait: ***    =====================================================================  DATA  Personal review of prior labs is notable for: ***  No results found for: "VITB12", "FOLATE", "VITD25", "VITD", "LDLCHOL", "HA1C", "TSH", "FREET4"  Personal review of imaging (with independent interpretation) is notable for: ***  Personal Review of other prior diagnostics is notable for: ***  =====================================================================    Orders  No orders of the defined types were placed in this encounter.

## 2023-08-08 ENCOUNTER — Ambulatory Visit (INDEPENDENT_AMBULATORY_CARE_PROVIDER_SITE_OTHER): Admitting: NEUROLOGY

## 2023-08-08 ENCOUNTER — Other Ambulatory Visit: Payer: Self-pay

## 2023-08-08 VITALS — BP 144/72 | HR 107 | Temp 97.6°F | Wt 172.2 lb

## 2023-08-08 DIAGNOSIS — E1142 Type 2 diabetes mellitus with diabetic polyneuropathy: Secondary | ICD-10-CM

## 2023-08-08 DIAGNOSIS — J449 Chronic obstructive pulmonary disease, unspecified: Secondary | ICD-10-CM

## 2023-08-08 DIAGNOSIS — J45909 Unspecified asthma, uncomplicated: Secondary | ICD-10-CM

## 2023-08-08 MED ORDER — GABAPENTIN 300 MG CAPSULE
300.0000 mg | ORAL_CAPSULE | Freq: Three times a day (TID) | ORAL | 2 refills | Status: DC
Start: 2023-08-08 — End: 2023-10-29

## 2023-08-08 MED ORDER — NICOTINE 14 MG/24 HR DAILY TRANSDERMAL PATCH
14.0000 mg | MEDICATED_PATCH | Freq: Every day | TRANSDERMAL | 0 refills | Status: AC
Start: 2023-08-08 — End: ?

## 2023-08-08 MED ORDER — NICOTINE 7 MG/24 HR DAILY TRANSDERMAL PATCH
7.0000 mg | MEDICATED_PATCH | Freq: Every day | TRANSDERMAL | 0 refills | Status: AC
Start: 2023-09-19 — End: ?

## 2023-08-08 NOTE — Progress Notes (Unsigned)
 NEUROLOGY, [name]  [address]  [place] [address]      ASSESSMENT/PLAN  No diagnosis found.: ***    Thank you for allowing me to participate in your patient's care and please do not hesitate to contact me for any questions or concerns.    {Time (Optional):52090}    [name], MD  Assistant Professor of Neurology  [place]  [facility]    =====================================================================    NAME:  Nathan Hopkins  DOB:  31-Aug-1960  VISIT DATE:  08/08/2023     CC:  ***    Patient seen in consultation at the request of No ref. provider found ***  History obtained from the patient and chart/records  Age of patient:  63 y.o.    I had the pleasure of seeing Nathan Hopkins for outpatient consultation, who is a 63 y.o. year old male and is being seen for management of above CC.    HPI:      08/08/2023  ***    =====================================================================  PMHx  Patient Active Problem List   Diagnosis    Iron deficiency anemia     Past Surgical History:   Procedure Laterality Date    DENTAL SURGERY      HIP SURGERY      artifical hip    HX APPENDECTOMY           Family Medical History:       Problem Relation (Age of Onset)    Alzheimer's/Dementia Mother    Lung Cancer Father            Current Outpatient Medications   Medication Sig Dispense Refill    ACCU-CHEK GUIDE TEST STRIPS Does not apply Strip       ACCU-CHEK SOFTCLIX LANCETS Misc Use to test blood sugar fingerstick Three times a day fingerstick Three times a day for 90 days      albuterol sulfate (PROVENTIL OR VENTOLIN OR PROAIR) 90 mcg/actuation Inhalation oral inhaler Take 1 Puff by inhalation      albuterol sulfate (PROVENTIL OR VENTOLIN OR PROAIR) 90 mcg/actuation Inhalation oral inhaler 1 puff as needed Inhalation every 4 hrs for 90 days      ANORO ELLIPTA 62.5-25 mcg/actuation Inhalation oral diskus inhaler       Benzonatate (TESSALON) 200 mg Oral  Capsule 1 Capsule (200 mg total)      busPIRone (BUSPAR) 15 mg Oral Tablet       celecoxib (CELEBREX) 200 mg Oral Capsule 1 capsule with food Orally 2 times a day for 90 days      clonazePAM (KLONOPIN) 1 mg Oral Tablet 1 Tablet (1 mg total)      CYMBALTA 60 mg Oral Capsule, Delayed Release(E.C.) 2 cap(s) orally once a day      hydrOXYzine pamoate (VISTARIL) 100 mg Oral Capsule as directed 3 times a day Orally three times a day      ipratropium-albuteroL (COMBIVENT RESPIMAT) 20-100 mcg/actuation Inhalation Mist Take 1 Puff by inhalation      ipratropium-albuterol 0.5 mg-3 mg(2.5 mg base)/3 mL Solution for Nebulization 3 ml by nebulizer 4 times a day for 90 days      Levocetirizine (XYZAL) 5 mg Oral Tablet 1 tablet in the evening Orally Once a day for 90 days      loratadine (CLARITIN) 10 mg Oral Tablet Take 1 Tablet (10 mg total) by mouth      MetFORMIN (GLUCOPHAGE) 1,000 mg Oral Tablet 1 tablet with a meal Orally  2 times a day for 90 days      Mirtazapine (REMERON) 45 mg Oral Tablet       montelukast (SINGULAIR) 10 mg Oral Tablet 1 Tablet (10 mg total)      nicotine (NICODERM CQ) 21 mg/24 hr Transdermal Patch 24 hr Place 1 Patch (21 mg total) on the skin      polysaccharide iron complex (FERREX 150) 150 mg iron Oral Capsule       prazosin (MINIPRESS) 2 mg Oral Capsule       PROTONIX 40 mg Oral Tablet, Delayed Release (E.C.) 1 tab(s) orally once a day for 90 days      rOPINIRole (REQUIP) 1 mg Oral Tablet 1 tab(s) orally 2 times a day for 90 days      SYMBICORT 160-4.5 mcg/actuation Inhalation oral inhaler 2 puffs Inhalation Twice a day for 90 days      tamsulosin (FLOMAX) 0.4 mg Oral Capsule 1 cap(s) orally once a day for 90 days      umeclidinium-vilanteroL (ANORO ELLIPTA) 62.5-25 mcg/actuation Inhalation oral diskus inhaler Take by inhalation Once a day      varenicline (CHANTIX) dose pak        No current facility-administered medications for this visit.     No Known Allergies  Social History     Socioeconomic  History    Marital status: Divorced     Spouse name: Not on file    Number of children: Not on file    Years of education: Not on file    Highest education level: Not on file   Occupational History    Not on file   Tobacco Use    Smoking status: Every Day     Types: Cigarettes    Smokeless tobacco: Never   Vaping Use    Vaping status: Former   Substance and Sexual Activity    Alcohol use: Yes    Drug use: Never    Sexual activity: Not on file   Other Topics Concern    Not on file   Social History Narrative    Not on file     Social Determinants of Health     Financial Resource Strain: Not on file   Transportation Needs: Not on file   Social Connections: Not on file   Intimate Partner Violence: Not on file   Housing Stability: Not on file       =====================================================================  GENERAL EXAMINATION  There were no vitals taken for this visit.    ***  Vital signs personally reviewed    General: No acute distress, alert  HEENT: Normocephalic, no scleral icterus  Pulmonary: No accessory muscle use, no tachypnea  Cardiovascular: Heart with regular rate & rhythm  Extremities: No significant edema, No cyanosis    NEUROLOGIC EXAM  MENTAL STATUS: alert and oriented to ***; speech clear and fluent with good repetition and comprehension; naming intact; attention/concentration normal; recent and remote memory intact with normal fund of knowledge; able to follow simple and complex axial and appendicular commands without L/R confusion.    CN  II: not directly tested, grossly intact  III, IV, VI: extraocular movements intact without nystagmus  V: intact to light touch  VII: face symmetric without weakness  VIII: grossly intact  IX, X: symmetric palatal elevation  XI: normal strength of trapezius and sternocleidomastoid bilaterally  XII: tongue midline with full movements    MOTOR  Bulk: normal  Tone: normal  Abnormal Movements: none  Fasciculations: none  Strength: Patient moving all  extremities symmetrically and against gravity with good strength.    Reflexes: ***    Sensory: Intact to Light Touch     Coordination: ***    Gait: ***    =====================================================================  DATA  Personal review of prior labs is notable for: ***  No results found for: "VITB12", "FOLATE", "VITD25", "VITD", "LDLCHOL", "HA1C", "TSH", "FREET4"  Personal review of imaging (with independent interpretation) is notable for: ***  Personal Review of other prior diagnostics is notable for: ***  =====================================================================    Orders  No orders of the defined types were placed in this encounter.

## 2023-08-14 ENCOUNTER — Encounter (INDEPENDENT_AMBULATORY_CARE_PROVIDER_SITE_OTHER): Payer: Self-pay | Admitting: NEUROLOGY

## 2023-08-14 DIAGNOSIS — E1142 Type 2 diabetes mellitus with diabetic polyneuropathy: Secondary | ICD-10-CM | POA: Insufficient documentation

## 2023-09-02 NOTE — ED Provider Notes (Signed)
 ED Provider Note    Subjective     Nathan Hopkins is a 63 y.o. male w/ past medical hx significant for GERD, hypertension, diabetes, COPD, unsteady gait who presents to ED c/o confusion and weakness that began 3x days PTA.  Fiance states that he has been confused recently w/ episodes of incomprehensible groaning and shaking. She states that he has been weak as well and not able to ambulate w/o assistance, and has difficulty standing on his own. Fiance also states that he has not been eating/drinking since symptoms began. He had fell out of bed approx a week ago and hit the Lt side of his head, and denies any headaches since this fall. He denies any ETOH use. He states that he feels fine on arrival to ED. Fiance denies any other complaints.       History provided by:  Medical records, patient and significant other  Language interpreter used: No        Objective     Vitals <redacted file path>:  ED Triage Vitals [09/02/23 1627]   BP Pulse Resp Temp SpO2 Flow (L/min) (Oxygen Therapy)   (!) 157/79 106 20 98.1 F (36.7 C) 96 % --     Physical Exam    I have reviewed the vital signs.  General:  Patient awake, alert, NAD.  Nontoxic appearing.   Head:  Atraumatic, normocephalic.    ENT:  PERRLA, EOM intact.  Oral mucosa is pink and moist with no lesions.  Neck is supple with full range of motion.    Cardiovascular:  RRR, No murmurs. Peripheral pulses palpable and equal bilaterally.  Respiratory:  Symmetrical chest wall expansion.  No rhonchi, rales, or wheezes.  Good air movement throughout.  No use of accessory muscles.    Extremities:  No cyanosis, or edema. Moving through full ROM without difficulty.   Abdomen:  Soft, nontender, nondistended.   No masses palpated.  Neuro:  GCS 15, moving all four extremities, interacting appropriately. CN 2-12 intact, 5/5 strength all 4x, he is alert and oriented. Some difficulty noted on finger-nose-finger. Small intention tremor present.    Psych:  Calm, appropriate.    Skin:   Warm, dry, no rash.      Assessment and Plan:     Medical Decision Making  63 y/o male presents to ED c/o confusion and weakness that began 3x days PTA, w/ intermittent episodes of incomprehensible groaning and shaking. On arrival to ED he is alert and oriented, and denies any acute complaints stating that he feels fine. On exam some difficulty noted on finger-nose-finger w/ small intention tremor. CN 2-12 intact, 5/5 strength in all 4x. Screening labs returned unremarkable w/ CT head negative for acute bleed and CT c-spine negative for acute fractures. Results were discussed w/ pt and fiance and they were advised to f/u w/ his neurologist for a possible outpatient EEG, and he was d/c to home w/ strict return precautions.  Patient eating goldfish and drinking soda in the room.  Ambulated with no difficulty in the department.  States he does not think anything is wrong.  Has normal mental status.  I watched the video of his episodes he was able to talk throughout them, feel he would benefit from an EEG but no EEG at this facility.  States he already has a neurologist.    Problems Addressed:  Acute encephalopathy: acute illness or injury  Fall on same level from stumbling, initial encounter: acute illness or injury  Amount and/or Complexity of Data Reviewed  Labs: ordered.  Radiology: ordered.     "Risk" in the MDM section refers to billing criteria on potential for complications and/or morbidity/mortality of management as defined by the AMA and CMS    ED Course <redacted file path>:         Procedures    Clinical Impression <redacted file path>:  1. Acute encephalopathy Acute   2. Fall on same level from stumbling, initial encounter Acute        Condition: Stable    Disposition <redacted file path>:Discharge    Documentation prepared by Sunny English, Scribe acting as medical scribe for Dr. Adaline Holly, MD.  I was present and performed active documentation with the provider while the patient was being cared  for in the emergency department. 09/02/23 5:08 PM    I verify the authenticity of this record as a scribed note undertaken during the medical encounter.  I performed the scribed service. The documentation accurately reflects the services I performed. Adaline Holly, MD

## 2023-09-02 NOTE — ED Notes (Addendum)
 Went over discharge instructions with pt, verbalizes understanding.  Denied having any further questions. Pain level is 0 , condition stable. Left ambulatory accompanied by family.  All belongings taken with patient. D/c vitals declined.

## 2023-09-04 ENCOUNTER — Emergency Department (HOSPITAL_COMMUNITY)

## 2023-09-04 ENCOUNTER — Other Ambulatory Visit: Payer: Self-pay

## 2023-09-04 ENCOUNTER — Encounter (HOSPITAL_COMMUNITY): Payer: Self-pay

## 2023-09-04 ENCOUNTER — Emergency Department
Admission: EM | Admit: 2023-09-04 | Discharge: 2023-09-04 | Disposition: A | Discharge status: Home / Self Care | Attending: Emergency Medicine | Admitting: Emergency Medicine

## 2023-09-04 DIAGNOSIS — D72829 Elevated white blood cell count, unspecified: Secondary | ICD-10-CM | POA: Insufficient documentation

## 2023-09-04 DIAGNOSIS — G9340 Encephalopathy, unspecified: Secondary | ICD-10-CM | POA: Insufficient documentation

## 2023-09-04 LAB — BLOOD GAS W/ CO-OX, LYTES, LACTATE REFLEX
%FIO2 (ARTERIAL): 21 %
BASE EXCESS (ARTERIAL): 1.5 mmol/L (ref 0.0–3.0)
BICARBONATE (ARTERIAL): 26.1 mmol/L (ref 21.0–28.0)
CARBOXYHEMOGLOBIN: 0.8 % (ref <=3.0)
CHLORIDE: 100 mmol/L (ref 98–107)
GLUCOSE: 156 mg/dL — ABNORMAL HIGH (ref 65–125)
HEMATOCRITRT: 41 % (ref 37–50)
HEMOGLOBIN: 13.6 g/dL (ref 12.0–18.0)
IONIZED CALCIUM: 1.29 mmol/L (ref 1.15–1.33)
LACTATE: 3 mmol/L — ABNORMAL HIGH (ref <=1.9)
MET-HEMOGLOBIN: <0.7 % (ref <=1.5)
O2 SATURATION (ARTERIAL): 96.1 % (ref 94.0–98.0)
O2CT: 18.5 %
OXYHEMOGLOBIN: 96.5 % — ABNORMAL HIGH (ref 90.0–95.0)
PAO2/FIO2 RATIO: 381
PCO2 (ARTERIAL): 39 mmHg (ref 35–45)
PH (ARTERIAL): 7.43 (ref 7.35–7.45)
PO2 (ARTERIAL): 80 mmHg — ABNORMAL LOW (ref 83–108)
SODIUM: 130 mmol/L — ABNORMAL LOW (ref 136–145)
WHOLE BLOOD POTASSIUM: 3.3 mmol/L — ABNORMAL LOW (ref 3.5–5.1)

## 2023-09-04 LAB — COMPREHENSIVE METABOLIC PANEL, NON-FASTING
ALBUMIN/GLOBULIN RATIO: 1.7 — ABNORMAL HIGH (ref 0.8–1.4)
ALBUMIN: 4.3 g/dL (ref 3.5–5.7)
ALKALINE PHOSPHATASE: 59 U/L (ref 34–104)
ALT (SGPT): 11 U/L (ref 7–52)
ANION GAP: 8 mmol/L (ref 4–13)
AST (SGOT): 14 U/L (ref 13–39)
BILIRUBIN TOTAL: 0.5 mg/dL (ref 0.3–1.0)
BUN/CREA RATIO: 7 (ref 6–22)
BUN: 7 mg/dL (ref 7–25)
CALCIUM, CORRECTED: 9.8 mg/dL (ref 8.9–10.8)
CALCIUM: 10 mg/dL (ref 8.6–10.3)
CHLORIDE: 99 mmol/L (ref 98–107)
CO2 TOTAL: 28 mmol/L (ref 21–31)
CREATININE: 1.07 mg/dL (ref 0.60–1.30)
ESTIMATED GFR: 79 mL/min/{1.73_m2} (ref >59)
GLOBULIN: 2.6 (ref 2.0–3.5)
GLUCOSE: 107 mg/dL (ref 74–109)
OSMOLALITY, CALCULATED: 269 mosm/kg — ABNORMAL LOW (ref 270–290)
POTASSIUM: 3.6 mmol/L (ref 3.5–5.1)
PROTEIN TOTAL: 6.9 g/dL (ref 6.4–8.9)
SODIUM: 135 mmol/L — ABNORMAL LOW (ref 136–145)

## 2023-09-04 LAB — URINALYSIS, MACROSCOPIC
BILIRUBIN: NEGATIVE mg/dL
BLOOD: NEGATIVE mg/dL
GLUCOSE: NEGATIVE mg/dL
KETONES: NEGATIVE mg/dL
LEUKOCYTES: NEGATIVE WBCs/uL
NITRITE: NEGATIVE
PH: 6.5 (ref 5.0–9.0)
PROTEIN: NEGATIVE mg/dL
SPECIFIC GRAVITY: 1.008 (ref 1.002–1.030)
UROBILINOGEN: NORMAL mg/dL

## 2023-09-04 LAB — CBC WITH DIFF
BASOPHIL #: 0.1 10*3/uL (ref 0.00–0.10)
BASOPHIL %: 0 % (ref 0–1)
EOSINOPHIL #: 1.2 10*3/uL — ABNORMAL HIGH (ref 0.00–0.60)
EOSINOPHIL %: 7 % (ref 1–8)
HCT: 39.7 % (ref 36.7–47.1)
HGB: 13.3 g/dL (ref 12.5–16.3)
LYMPHOCYTE #: 1.8 10*3/uL (ref 1.00–3.00)
LYMPHOCYTE %: 11 % — ABNORMAL LOW (ref 15–43)
MCH: 29.4 pg (ref 23.8–33.4)
MCHC: 33.4 g/dL (ref 32.5–36.3)
MCV: 88 fL (ref 73.0–96.2)
MONOCYTE #: 0.9 10*3/uL (ref 0.30–1.10)
MONOCYTE %: 6 % (ref 6–14)
MPV: 7.2 fL — ABNORMAL LOW (ref 7.4–11.4)
NEUTROPHIL #: 12.2 10*3/uL — ABNORMAL HIGH (ref 1.70–7.60)
NEUTROPHIL %: 75 % — ABNORMAL HIGH (ref 44–74)
PLATELETS: 266 10*3/uL (ref 140–440)
RBC: 4.52 10*6/uL (ref 4.06–5.63)
RDW: 14.9 % (ref 12.1–16.2)
WBC: 16.2 10*3/uL — ABNORMAL HIGH (ref 3.6–10.2)

## 2023-09-04 LAB — AMMONIA: AMMONIA: 24 umol/L (ref 16–53)

## 2023-09-04 LAB — THYROID STIMULATING HORMONE (SENSITIVE TSH): TSH: 1.53 u[IU]/mL (ref 0.450–5.330)

## 2023-09-04 LAB — URINALYSIS, MICROSCOPIC
RBCS: <1 /HPF (ref <4)
WBCS: 1 /HPF (ref <6)

## 2023-09-04 LAB — DRUG SCREEN, NO CONFIRMATION, URINE
AMPHETAMINES URINE: NEGATIVE
BARBITURATES URINE: NEGATIVE
BENZODIAZEPINES URINE: NEGATIVE
BUPRENORPHINE URINE: NEGATIVE
CANNABINOIDS URINE: NEGATIVE
COCAINE METABOLITES URINE: NEGATIVE
FENTANYL, URINE: NEGATIVE
METHADONE URINE: NEGATIVE
OPIATES URINE: NEGATIVE
OXYCODONE URINE: NEGATIVE
PCP URINE: NEGATIVE

## 2023-09-04 LAB — MAGNESIUM: MAGNESIUM: 1.5 mg/dL — ABNORMAL LOW (ref 1.9–2.7)

## 2023-09-04 LAB — BLUE TOP TUBE

## 2023-09-04 LAB — ETHANOL, SERUM/PLASMA: ETHANOL: <10 mg/dL

## 2023-09-04 LAB — GRAY TOP TUBE

## 2023-09-04 NOTE — ED Provider Notes (Signed)
 [facility]  ED Primary Provider Note  Patient Name: Nathan Hopkins  Patient Age: 63 y.o.  Date of Birth: [birth year]    Chief Complaint: Altered Mental Status        History of Present Illness       Nathan Hopkins is a 63 y.o. male who had concerns including Altered Mental Status.  The patient is a 63 year old who presents with altered mental status to the emergency department.  Patient presents with a request of his primary care provider who told him he could see him an in person neurologist in the ED. the patient's wife states there has been an episode of shaking, however the patient has never been diagnosed with seizure.  That has also reported altered mental status.        Review of Systems     No other overt Review of Systems are noted to be positive except noted in the HPI.      Historical Data   History Reviewed This Encounter:        Physical Exam   ED Triage Vitals [09/04/23 1434]   BP (Non-Invasive) 123/82   Heart Rate (!) 101   Respiratory Rate 20   Temperature 36.3 C (97.3 F)   SpO2 98 %   Weight 77.1 kg (170 lb)   Height 1.707 m (5' 7.2")         Nursing notes reviewed for what could be assessed. Past Medical, Surgical, and Social history reviewed.     Constitutional: NAD. Well-Developed. Well Nourished.  Head: Normocephalic, atraumatic.  Mouth/Throat:  Symmetric facial movement.  Eyes: EOM grossly intact, conjunctiva normal.  Neck: Supple  Cardiovascular: Extremities well perfused.  Pulmonary/Chest: No respiratory distress.   Abdominal: Non-distended. No overt peritoneal findings.   MSK: No Lower Extremity Edema.  Skin: Warm, dry.  Neuro: Appropriate, CN II-XII grossly intact. Gait not ataxic.  No active seizure.  No stroke-like symptoms.  Psych: Pleasant            Procedures      Patient Data     Labs Ordered/Reviewed   COMPREHENSIVE METABOLIC PANEL, NON-FASTING - Abnormal; Notable for the following components:       Result Value    SODIUM 135 (*)      ALBUMIN/GLOBULIN RATIO 1.7 (*)     OSMOLALITY, CALCULATED 269 (*)     All other components within normal limits    Narrative:     Estimated Glomerular Filtration Rate (eGFR) is calculated using the CKD-EPI (2021) equation, intended for patients 69 years of age and older. If gender is not documented or "unknown", there will be no eGFR calculation.     MAGNESIUM - Abnormal; Notable for the following components:    MAGNESIUM 1.5 (*)     All other components within normal limits   CBC WITH DIFF - Abnormal; Notable for the following components:    WBC 16.2 (*)     MPV 7.2 (*)     NEUTROPHIL % 75 (*)     LYMPHOCYTE % 11 (*)     NEUTROPHIL # 12.20 (*)     EOSINOPHIL # 1.20 (*)     All other components within normal limits   BLOOD GAS W/ CO-OX, LYTES, LACTATE REFLEX - Abnormal; Notable for the following components:    PO2 (ARTERIAL) 80 (*)     OXYHEMOGLOBIN 96.5 (*)     SODIUM 130 (*)     WHOLE BLOOD POTASSIUM 3.3 (*)  GLUCOSE 156 (*)     LACTATE 3.0 (*)     All other components within normal limits    Narrative:     A reference range for Oxygen Saturation is provided but abnormal results will not flag in Epic.   AMMONIA - Normal   DRUG SCREEN, NO CONFIRMATION, URINE - Normal    Narrative:     Any results reported as "positive" on this urine drug screen are unconfirmed screening results and should be used for medical(i.e.,treatment)purposes only. Unconfirmed screening results must not be used for non-medical purposes (e.g. employment or legal testing). Upon request, all results reported as "positive" can be sent to a reference laboratory for confirmation by GCMS.     Reporting Limits (cut-off concentrations)     Cocaine 300 ng/mL  Opiates 300 ng/mL  THC 50 ng/mL  Amphetamine 1000 ng/mL  Phencyclidine 25 ng/mL  Benzodiazepine 300 ng/mL  Barbiturates 300 ng/mL  Methadone 300 ng/mL  Oxycodone 100 ng/mL  Buprenorphine 5 ng/mL  Fentanyl 5 ng/mL     URINALYSIS, MACROSCOPIC - Normal   URINALYSIS, MICROSCOPIC - Normal   ETHANOL,  SERUM/PLASMA - Normal   THYROID STIMULATING HORMONE (SENSITIVE TSH) - Normal   CBC/DIFF    Narrative:     The following orders were created for panel order CBC/DIFF.  Procedure                               Abnormality         Status                     ---------                               -----------         ------                     CBC WITH QMVH[[id number]]                Abnormal            Final result                 Please view results for these tests on the individual orders.   URINALYSIS, MACROSCOPIC AND MICROSCOPIC W/CULTURE REFLEX    Narrative:     The following orders were created for panel order URINALYSIS, MACROSCOPIC AND MICROSCOPIC W/CULTURE REFLEX.  Procedure                               Abnormality         Status                     ---------                               -----------         ------                     URINALYSIS, MACROSCOPIC[[id number]]      Normal              Final result               URINALYSIS, MICROSCOPIC[[id number]]  Normal              Final result                 Please view results for these tests on the individual orders.   EXTRA TUBES    Narrative:     The following orders were created for panel order EXTRA TUBES.  Procedure                               Abnormality         Status                     ---------                               -----------         ------                     BLUE TOP ZOXW[[id number]]                                    Final result               GRAY TOP JXBJ[[id number]]                                    Final result                 Please view results for these tests on the individual orders.   BLUE TOP TUBE   GRAY TOP TUBE       XR CHEST AP AND LATERAL   Final Result by Edi, Radresults In (05/14 1704)   NO ACUTE FINDINGS.            Radiologist location ID: [id number]             Medical Decision Making          Medical Decision Making          Studies Assessed:  Lab, radiology      MDM Narrative:  This patient is a 63 year old male  who presents with reported altered mental status however the patient is very alert on bedside discussion.  He has history of shaking which the wife has on video.  He has never been formally diagnosed with seizure-like activity.  He does not have any active stroke-like symptom on bedside evaluation.  The patient was referred to the emergency department to be seen by a neurologist.  We do not have in-person neurology services available with a point in time.  I did reach out to Dr. Kathe Pallas, neurologist associated with the hospital, who agreed to see the patient in follow up in clinic.  He did request an EEG which was ordered.        Differential includes but not limited to:  Intracranial abnormality: Not noted  Seizure: Not actively present  Stroke: No active symptoms      Follow-Up Discussion:  Patient updated and agreeable to follow up with Neurology.    Please see documentation above for specific labs and radiology.    Decision for and Complexity of Risk in the ED encounter:  Independent/Additional Historian:  The patient is wife with information given about the patient.  Discussion of care with a provider outside of the Emergency Department: Dr. Reinhold Carbine, Neurology.                                     Following the history, physical exam, and ED workup, the patient was deemed stable and suitable for discharge. The patient/caregiver was advised to return to the ED for any new or worsening symptoms. Discharge medications, and follow-up instructions were discussed with the patient/caregiver in detail, who verbalizes understanding. The patient/caregiver is in agreement and is comfortable with the plan of care.    Disposition: Discharged         Current Discharge Medication List        CONTINUE these medications - NO CHANGES were made during your visit.        Details   Accu-Chek Guide test strips Strip  Generic drug: Blood Sugar Diagnostic   Refills: 0     Accu-Chek Softclix Lancets Misc  Generic drug: Lancets   Use  to test blood sugar fingerstick Three times a day fingerstick Three times a day for 90 days  Refills: 0     * albuterol sulfate 90 mcg/actuation oral inhaler  Commonly known as: PROVENTIL or VENTOLIN or PROAIR   1 puff as needed Inhalation every 4 hrs for 90 days  Refills: 0     * albuterol sulfate 90 mcg/actuation oral inhaler  Commonly known as: PROVENTIL or VENTOLIN or PROAIR   1 Puff, Inhalation  Refills: 0     * Anoro Ellipta 62.5-25 mcg/actuation oral diskus inhaler  Generic drug: umeclidinium-vilanteroL   Inhalation, Daily  Refills: 0     * Anoro Ellipta 62.5-25 mcg/actuation oral diskus inhaler  Generic drug: umeclidinium-vilanteroL   Refills: 0     Benzonatate 200 mg Capsule  Commonly known as: TESSALON   1 Capsule  Refills: 0     busPIRone 15 mg Tablet  Commonly known as: BUSPAR   Refills: 0     celecoxib 200 mg Capsule  Commonly known as: CeleBREX   1 capsule with food Orally 2 times a day for 90 days  Refills: 0     clonazePAM 1 mg Tablet  Commonly known as: klonoPIN   1 Tablet  Refills: 0     Cymbalta 60 mg Capsule, Delayed Release(E.C.)  Generic drug: DULoxetine   2 cap(s) orally once a day  Refills: 0     gabapentin  300 mg Capsule  Commonly known as: NEURONTIN    300 mg, Oral, 3 TIMES DAILY  Qty: 90 Capsule  Refills: 2     hydrOXYzine pamoate 100 mg Capsule  Commonly known as: VISTARIL   as directed 3 times a day Orally three times a day  Refills: 0     * ipratropium-albuteroL 0.5 mg-3 mg(2.5 mg base)/3 mL nebulizer solution  Commonly known as: DUONEB   3 ml by nebulizer 4 times a day for 90 days  Refills: 0     * ipratropium-albuteroL 20-100 mcg/actuation Mist  Commonly known as: COMBIVENT RESPIMAT   1 Puff, Inhalation  Refills: 0     Levocetirizine 5 mg Tablet  Commonly known as: XYZAL   1 tablet in the evening Orally Once a day for 90 days  Refills: 0     loratadine 10 mg Tablet  Commonly known as: CLARITIN  10 mg, Oral  Refills: 0     MetFORMIN 1,000 mg Tablet  Commonly known as: GLUCOPHAGE   1  tablet with a meal Orally 2 times a day for 90 days  Refills: 0     Mirtazapine 45 mg Tablet  Commonly known as: REMERON   Refills: 0     montelukast 10 mg Tablet  Commonly known as: SINGULAIR   1 Tablet  Refills: 0     * nicotine  14 mg/24 hr Patch 24 hr  Commonly known as: NICODERM CQ    14 mg, Transdermal, Daily  Qty: 42 Patch  Refills: 0     * nicotine  7 mg/24 hr Patch 24 hr  Commonly known as: NICODERM CQ   Start taking on: Sep 19, 2023   7 mg, Transdermal, Daily  Qty: 14 Patch  Refills: 0     polysaccharide iron complex 150 mg iron Capsule  Commonly known as: FERREX 150   Refills: 0     prazosin 2 mg Capsule  Commonly known as: MINIPRESS   Refills: 0     Protonix 40 mg Tablet, Delayed Release (E.C.)  Generic drug: pantoprazole   1 tab(s) orally once a day for 90 days  Refills: 0     rOPINIRole 1 mg Tablet  Commonly known as: REQUIP   1 tab(s) orally 2 times a day for 90 days  Refills: 0     Symbicort 160-4.5 mcg/actuation oral inhaler  Generic drug: budesonide-formoteroL   2 puffs Inhalation Twice a day for 90 days  Refills: 0     tamsulosin 0.4 mg Capsule  Commonly known as: FLOMAX   1 cap(s) orally once a day for 90 days  Refills: 0           * This list has 8 medication(s) that are the same as other medications prescribed for you. Read the directions carefully, and ask your doctor or other care provider to review them with you.                Follow up:   No follow-up provider specified.             "Risk" as noted in the medical decision-making is in reference to potential morbidity/mortality of management based upon previously established billing guidelines.    Based upon the clinical setting, the likely diagnosis/impression include:    Clinical Impression   Encephalopathy, unspecified type (Primary)         Discharge Medication List as of 09/04/2023  4:44 PM                This note was partially created using voice recognition software and is inherently subject to errors including those of syntax and "sound  alike " substitutions which may escape proof reading. In such instances, original meaning may be extrapolated by contextual derivation.      /R. Ascencion Lava, MD, Rexann Catalan  Department of Emergency Medicine  [facility]

## 2023-09-04 NOTE — Respiratory Therapy (Signed)
 Latest Reference Range & Units 09/04/23 16:15   %FIO2 (ARTERIAL) % 21   PH 7.35 - 7.45  7.43   PCO2 35 - 45 mm/Hg 39   PO2 83 - 108 mm/Hg 80 (L)   BICARBONATE 21.0 - 28.0 mmol/L 26.1   BASE EXCESS 0.0 - 3.0 mmol/L 1.5   PAO2/FIO2 RATIO  381   O2CT % 18.5   O2 SATURATION (ARTERIAL) 94.0 - 98.0 % 96.1   HEMATOCRITRT 37 - 50 % 41   CARBOXYHEMOGLOBIN <=3.0 % 0.8   HEMOGLOBIN 12.0 - 18.0 g/dL 21.3   MET-HEMOGLOBIN <=0.8 % <0.7   O2HB 90.0 - 95.0 % 96.5 (H)   SODIUM 136 - 145 mmol/L 130 (L)   LACTATE <=1.9 mmol/L 3.0 (H)   CHLORIDE 98 - 107 mmol/L 100   GLUCOSE 65 - 125 mg/dL 657 (H)   IONIZED CALCIUM 1.15 - 1.33 mmol/L 1.29   WHOLE BLOOD K+ 3.5 - 5.1 mmol/L 3.3 (L)   (L): Data is abnormally low  (H): Data is abnormally high      RESULTS REPORTED TO DR. Lawrance Presume

## 2023-09-04 NOTE — ED Nurses Note (Signed)
 Patient discharged home with family. AVS reviewed with patient. A written copy of the AVS and discharge instructions were given to the patient. Questions sufficiently answered as needed. Patient encouraged to follow up with PCP as indicated. In the event of an emergency, patient instructed to call 911 or go to the nearest emergency room. Patient left department via ambulation.

## 2023-09-04 NOTE — Discharge Instructions (Addendum)
 Thank you for allowing us  to be part of your care.    Please call to schedule your EEG.  Please follow up with Neurology with the referral provided.    Please discuss all medications with your pharmacist to ensure there are no concerns of interactions.    Please ensure all questions or concerns are addressed prior to leaving the hospital. We want to make sure your concerns are addressed to make sure you are as safe and healthy as possible. By leaving the hospital, it is understood you are in agreement with your treatment plan.    You may have received sedating medication during your visit. Please discuss this with your discharging provider nurse as you may not be able to operate machines while the medication is in your system, or while you are taking any potentially sedating prescriptions.    Please call the hospital medical records office for a copy of your finalized results, and review them with a primary care physician, for any findings needing further attention.    If you feel your situation worsens, or does not get better in 48 hours, please see a physician for evaluation.    We encourage you to see your regular doctor as soon as possible to let them know you were seen in the emergency department. They may want to do further testing. If you do not have a doctor, please feel free to call the hospital, and ask for contact information of accepting providers. Please also discuss your vaccinations, and ensure all are up to date.    You may use this document to take today off work or school.

## 2023-09-04 NOTE — ED Triage Notes (Signed)
 Ams  girlfriend report seen at [facility] for same . Seen PCP today refer to er . Pt is 4-5-6 in triage .

## 2023-09-10 ENCOUNTER — Encounter (INDEPENDENT_AMBULATORY_CARE_PROVIDER_SITE_OTHER): Payer: Self-pay

## 2023-09-19 ENCOUNTER — Other Ambulatory Visit: Payer: Self-pay

## 2023-09-19 ENCOUNTER — Ambulatory Visit
Admission: RE | Admit: 2023-09-19 | Discharge: 2023-09-19 | Disposition: A | Discharge status: Home / Self Care | Payer: Self-pay | Source: Ambulatory Visit | Attending: Emergency Medicine | Admitting: Emergency Medicine

## 2023-09-19 DIAGNOSIS — G9340 Encephalopathy, unspecified: Secondary | ICD-10-CM | POA: Insufficient documentation

## 2023-09-20 NOTE — Procedures (Signed)
 ELECTROENCEPHALOGRAM REPORT    Test Date: Sep 19, 2023    Interpretation Date: Sep 20, 2023    Location: [facility]    Patient Referred By: Dolan Freiberg, MD    EEG Number: [id number]      INDICATIONS FOR PROCEDURE: He is a 63 year old man who is referred for electrodiagnostic testing for episode of altered mentation with shaking activity.     Medications:   Current Outpatient Medications   Medication Instructions    ACCU-CHEK GUIDE TEST STRIPS Does not apply Strip     ACCU-CHEK SOFTCLIX LANCETS Misc Use to test blood sugar fingerstick Three times a day fingerstick Three times a day for 90 days    albuterol sulfate (PROVENTIL OR VENTOLIN OR PROAIR) 90 mcg/actuation Inhalation oral inhaler 1 Puff, Inhalation    albuterol sulfate (PROVENTIL OR VENTOLIN OR PROAIR) 90 mcg/actuation Inhalation oral inhaler 1 puff as needed Inhalation every 4 hrs for 90 days    ANORO ELLIPTA 62.5-25 mcg/actuation Inhalation oral diskus inhaler     Benzonatate (TESSALON) 200 mg Oral Capsule 1 Capsule    busPIRone (BUSPAR) 15 mg Oral Tablet     celecoxib (CELEBREX) 200 mg Oral Capsule 1 capsule with food Orally 2 times a day for 90 days    clonazePAM (KLONOPIN) 1 mg Oral Tablet 1 Tablet    CYMBALTA 60 mg Oral Capsule, Delayed Release(E.C.) 2 cap(s) orally once a day    gabapentin  (NEURONTIN ) 300 mg, Oral, 3 TIMES DAILY    hydrOXYzine pamoate (VISTARIL) 100 mg Oral Capsule as directed 3 times a day Orally three times a day    ipratropium-albuteroL (COMBIVENT RESPIMAT) 20-100 mcg/actuation Inhalation Mist 1 Puff, Inhalation    ipratropium-albuterol 0.5 mg-3 mg(2.5 mg base)/3 mL Solution for Nebulization 3 ml by nebulizer 4 times a day for 90 days    Levocetirizine (XYZAL) 5 mg Oral Tablet 1 tablet in the evening Orally Once a day for 90 days    loratadine (CLARITIN) 10 mg, Oral    MetFORMIN (GLUCOPHAGE) 1,000 mg Oral Tablet 1 tablet with a meal Orally 2 times a day for 90 days    Mirtazapine (REMERON)  45 mg Oral Tablet     montelukast (SINGULAIR) 10 mg Oral Tablet 1 Tablet    nicotine  (NICODERM CQ ) 14 mg, Transdermal, Daily    nicotine  (NICODERM CQ ) 7 mg, Transdermal, Daily    polysaccharide iron complex (FERREX 150) 150 mg iron Oral Capsule     prazosin (MINIPRESS) 2 mg Oral Capsule     PROTONIX 40 mg Oral Tablet, Delayed Release (E.C.) 1 tab(s) orally once a day for 90 days    rOPINIRole (REQUIP) 1 mg Oral Tablet 1 tab(s) orally 2 times a day for 90 days    SYMBICORT 160-4.5 mcg/actuation Inhalation oral inhaler 2 puffs Inhalation Twice a day for 90 days    tamsulosin (FLOMAX) 0.4 mg Oral Capsule 1 cap(s) orally once a day for 90 days    umeclidinium-vilanteroL (ANORO ELLIPTA) 62.5-25 mcg/actuation Inhalation oral diskus inhaler Inhalation, Daily        DESCRIPTION OF PROCEDURE: Electrodes were applied using paste technique in positions dictated by the International 10-20 system of placement. Recording montages included both referential and bipolar derivations. In addition to EEG data, EKG was recorded.     This is a routine EEG recorded on Sep 19, 2023, running from 07:49:20 until 08:19:56 for 30 minutes and 29 seconds of data.    DESCRIPTION OF ACTIVITIES: At the onset  of the recording, the patient is awake laying supine on the stretcher with his eyes open. The posterior dominant rhythm reaches 9 Hz, with amplitudes of 30 to 60 microvolts. There are lower amplitude fast frequencies in the anterior head regions, indicating maintenance of an anterior to posterior gradient.    We do see evidence of drowsiness and light sleep, with the appearance of vertex waves. The patient does enter stage II sleep, with the appearance of sleep spindles.    Photic stimulation does not produce a driving response or epileptiform activation. Hyperventilation is not performed.    No epileptiform discharges or seizures are seen.    A single channel EKG shows normal sinus rhythm.    CLINICAL INTERPRETATION: This routine length EEG  with video is normal, encompassing the awake and sleep states. Note that a single normal EEG does not exclude the diagnosis of epilepsy, and clinical correlation is advised.    Veda Gerald, DO  Assistant Professor of Neurology  [place]  [facility]

## 2023-10-06 ENCOUNTER — Emergency Department (HOSPITAL_BASED_OUTPATIENT_CLINIC_OR_DEPARTMENT_OTHER)

## 2023-10-06 ENCOUNTER — Other Ambulatory Visit: Payer: Self-pay

## 2023-10-06 ENCOUNTER — Encounter (HOSPITAL_BASED_OUTPATIENT_CLINIC_OR_DEPARTMENT_OTHER): Payer: Self-pay

## 2023-10-06 ENCOUNTER — Emergency Department
Admission: EM | Admit: 2023-10-06 | Discharge: 2023-10-06 | Disposition: A | Discharge status: Home / Self Care | Attending: Family Medicine | Admitting: Family Medicine

## 2023-10-06 DIAGNOSIS — Z720 Tobacco use: Secondary | ICD-10-CM | POA: Insufficient documentation

## 2023-10-06 DIAGNOSIS — F17210 Nicotine dependence, cigarettes, uncomplicated: Secondary | ICD-10-CM

## 2023-10-06 DIAGNOSIS — J45909 Unspecified asthma, uncomplicated: Secondary | ICD-10-CM

## 2023-10-06 DIAGNOSIS — J441 Chronic obstructive pulmonary disease with (acute) exacerbation: Secondary | ICD-10-CM | POA: Insufficient documentation

## 2023-10-06 DIAGNOSIS — J40 Bronchitis, not specified as acute or chronic: Secondary | ICD-10-CM

## 2023-10-06 LAB — CBC WITH DIFF
BASOPHIL #: 0.02 10*3/uL (ref 0.00–0.10)
BASOPHIL %: 0 % (ref 0–1)
EOSINOPHIL #: 1.05 10*3/uL — ABNORMAL HIGH (ref 0.00–0.60)
EOSINOPHIL %: 10 % — ABNORMAL HIGH (ref 1–8)
HCT: 39.3 % (ref 36.7–47.1)
HGB: 13 g/dL (ref 12.5–16.3)
LYMPHOCYTE #: 1.98 10*3/uL (ref 1.00–3.00)
LYMPHOCYTE %: 18 % (ref 15–43)
MCH: 30.5 pg (ref 23.8–33.4)
MCHC: 33.2 g/dL (ref 32.5–36.3)
MCV: 92 fL (ref 73.0–96.2)
MONOCYTE #: 1.05 10*3/uL — ABNORMAL HIGH (ref 0.30–1.00)
MONOCYTE %: 10 % (ref 6–14)
MPV: 7.3 fL — ABNORMAL LOW (ref 7.4–11.4)
NEUTROPHIL #: 6.68 10*3/uL (ref 1.85–7.84)
NEUTROPHIL %: 62 % (ref 44–74)
PLATELETS: 211 10*3/uL (ref 140–440)
RBC: 4.27 10*6/uL (ref 4.06–5.63)
RDW: 18.4 % — ABNORMAL HIGH (ref 12.1–16.2)
WBC: 10.8 10*3/uL — ABNORMAL HIGH (ref 3.6–10.2)

## 2023-10-06 LAB — TROPONIN-I: TROPONIN I: 6 ng/L (ref <20)

## 2023-10-06 LAB — COMPREHENSIVE METABOLIC PANEL, NON-FASTING
ALBUMIN/GLOBULIN RATIO: 1.1 (ref 0.8–1.4)
ALBUMIN: 3.6 g/dL (ref 3.4–5.0)
ALKALINE PHOSPHATASE: 114 U/L (ref 46–116)
ALT (SGPT): 13 U/L (ref <=78)
ANION GAP: 7 mmol/L (ref 4–13)
AST (SGOT): 18 U/L (ref 15–37)
BILIRUBIN TOTAL: 0.4 mg/dL (ref 0.2–1.0)
BUN/CREA RATIO: 8
BUN: 10 mg/dL (ref 7–18)
CALCIUM, CORRECTED: 9.8 mg/dL
CALCIUM: 9.5 mg/dL (ref 8.5–10.1)
CHLORIDE: 100 mmol/L (ref 98–107)
CO2 TOTAL: 30 mmol/L (ref 21–32)
CREATININE: 1.18 mg/dL (ref 0.70–1.30)
ESTIMATED GFR: 71 mL/min/{1.73_m2} (ref >59)
GLOBULIN: 3.3
GLUCOSE: 99 mg/dL (ref 74–106)
OSMOLALITY, CALCULATED: 273 mosm/kg (ref 270–290)
POTASSIUM: 4.1 mmol/L (ref 3.5–5.1)
PROTEIN TOTAL: 6.9 g/dL (ref 6.4–8.2)
SODIUM: 137 mmol/L (ref 136–145)

## 2023-10-06 LAB — MAGNESIUM: MAGNESIUM: 1.6 mg/dL — ABNORMAL LOW (ref 1.8–2.4)

## 2023-10-06 LAB — PT/INR
INR: 1.05 (ref 0.84–1.10)
PROTHROMBIN TIME: 11.9 s (ref 9.8–12.7)

## 2023-10-06 LAB — PTT (PARTIAL THROMBOPLASTIN TIME): APTT: 34.6 s (ref 25.0–38.0)

## 2023-10-06 MED ORDER — ALBUTEROL SULFATE HFA 90 MCG/ACTUATION AEROSOL INHALER
INHALATION_SPRAY | RESPIRATORY_TRACT | Status: AC
Start: 2023-10-06 — End: 2023-10-06
  Filled 2023-10-06: qty 6.7

## 2023-10-06 MED ORDER — METHYLPREDNISOLONE SOD SUCC 125 MG SOLUTION FOR INJECTION WRAPPER
INTRAVENOUS | Status: AC
Start: 2023-10-06 — End: 2023-10-06
  Filled 2023-10-06: qty 2

## 2023-10-06 MED ORDER — ALBUTEROL SULFATE 2.5 MG/3 ML (0.083 %) SOLUTION FOR NEBULIZATION
2.5000 mg | INHALATION_SOLUTION | RESPIRATORY_TRACT | 0 refills | Status: AC | PRN
Start: 2023-10-06 — End: ?

## 2023-10-06 MED ORDER — AZITHROMYCIN 250 MG TABLET
ORAL_TABLET | ORAL | Status: AC
Start: 2023-10-06 — End: 2023-10-06
  Filled 2023-10-06: qty 2

## 2023-10-06 MED ORDER — AZITHROMYCIN 250 MG TABLET
500.0000 mg | ORAL_TABLET | ORAL | Status: AC
Start: 2023-10-06 — End: 2023-10-06
  Administered 2023-10-06: 500 mg via ORAL

## 2023-10-06 MED ORDER — PREDNISONE 50 MG TABLET
50.0000 mg | ORAL_TABLET | Freq: Every day | ORAL | 0 refills | Status: DC
Start: 2023-10-07 — End: 2023-11-10

## 2023-10-06 MED ORDER — AEROCHAMBER W/ FLOWSIGNAL SPACER
Freq: Once | Status: AC
Start: 2023-10-06 — End: 2023-10-06
  Filled 2023-10-06: qty 1

## 2023-10-06 MED ORDER — IPRATROPIUM 0.5 MG-ALBUTEROL 3 MG (2.5 MG BASE)/3 ML NEBULIZATION SOLN
3.0000 mL | INHALATION_SOLUTION | RESPIRATORY_TRACT | Status: AC
Start: 2023-10-06 — End: 2023-10-06
  Administered 2023-10-06: 3 mL via RESPIRATORY_TRACT

## 2023-10-06 MED ORDER — METHYLPREDNISOLONE SOD SUCC 125 MG SOLUTION FOR INJECTION WRAPPER
125.0000 mg | INTRAVENOUS | Status: AC
Start: 2023-10-06 — End: 2023-10-06
  Administered 2023-10-06: 125 mg via INTRAVENOUS

## 2023-10-06 MED ORDER — AZITHROMYCIN 250 MG TABLET
250.0000 mg | ORAL_TABLET | Freq: Every day | ORAL | 0 refills | Status: DC
Start: 2023-10-07 — End: 2023-11-10

## 2023-10-06 MED ORDER — ALBUTEROL SULFATE HFA 90 MCG/ACTUATION AEROSOL INHALER
2.0000 | INHALATION_SPRAY | RESPIRATORY_TRACT | Status: AC
Start: 2023-10-06 — End: 2023-10-06
  Administered 2023-10-06: 2 via RESPIRATORY_TRACT

## 2023-10-06 MED ORDER — MAGNESIUM OXIDE 400 MG (241.3 MG MAGNESIUM) TABLET
ORAL_TABLET | ORAL | Status: AC
Start: 2023-10-06 — End: 2023-10-06
  Filled 2023-10-06: qty 1

## 2023-10-06 MED ORDER — MAGNESIUM OXIDE 400 MG (241.3 MG MAGNESIUM) TABLET
400.0000 mg | ORAL_TABLET | ORAL | Status: AC
Start: 2023-10-06 — End: 2023-10-06
  Administered 2023-10-06: 400 mg via ORAL

## 2023-10-06 MED ORDER — IPRATROPIUM 0.5 MG-ALBUTEROL 3 MG (2.5 MG BASE)/3 ML NEBULIZATION SOLN
INHALATION_SOLUTION | RESPIRATORY_TRACT | Status: AC
Start: 2023-10-06 — End: 2023-10-06
  Filled 2023-10-06: qty 3

## 2023-10-06 NOTE — ED Nurses Note (Signed)
 Petechiae and redness noted around IV site to right wrist/forearm, prior to admin of ordered medication. Provider aware, advised to remove IV.

## 2023-10-06 NOTE — ED Provider Notes (Signed)
 [facility]  ED Primary Provider Note  Patient Name: Nathan Hopkins  Patient Age: 63 y.o.  Date of Birth: [birth year]    Chief Complaint: Shortness of Breath (Ems reports pt was at [facility], did not have his inhaler today. Pt c/o cough with clear/white phlegm since last night. Pt c/o was sitting in car with wife and had shortness of breath, pt did not have inhaler with him. Ems reports pt had labored respiration/wheezing and initial O2 sat 90%. 18 ga rt wrist, Duo neb in route, blood sugar 100. Pt reports feeling better in route )        History of Present Illness       Nathan Hopkins is a 64 y.o. male who had concerns including Shortness of Breath.  PATIENT PRESENTED TO THE EMERGENCY DEPARTMENT WITH COMPLAINTS OF SHORTNESS OF BREATH.  PATIENT STATES THAT HE HAS CHRONIC OBSTRUCTIVE PULMONARY DISEASE AND ASTHMA.  HE WAS OUT SHOPPING, WHEN HE STARTED GETTING SHORT OF BREATH AND COULD NOT CATCH HIS BREATH.  PATIENT STATES THAT HE WAS OUT OF HIS NEBULIZER MEDICATION AS WELL AS HIS ALBUTEROL INHALER.  HE CONTACTED EMS FOR TRANSPORT TO THE EMERGENCY DEPARTMENT.  PATIENT DID RECEIVE DUONEB IN ROUTE WITH SIGNIFICANT IMPROVEMENT IN SYMPTOMS.  HOWEVER, PATIENT DOES ALSO ADMIT TO A CLOSE FAMILY MEMBER THAT WAS RECENTLY DIAGNOSED WITH BRONCHITIS.  PATIENT CONTINUES TO SMOKE DAILY, DESPITE HIS CHRONIC OBSTRUCTIVE PULMONARY DISEASE AND MULTIPLE EMERGENCY DEPARTMENT VISITS FOR CHRONIC OBSTRUCTIVE PULMONARY DISEASE EXACERBATIONS.  HE DENIES ANY ASSOCIATED FEVER OR CHILLS.  HE STATES THAT HE HAD SOME CHEST TIGHTNESS WHEN HIS SYMPTOMS BEGAN TODAY, BUT THAT RESOLVED AFTER THE DUONEB.  HE DENIES ANY ABDOMINAL PAIN, SWELLING OF THE LOWER EXTREMITIES OR RECENT CHANGES IN BOWEL OR BLADDER HABITS.  PATIENT DENIES ANY FURTHER COMPLAINTS AT TIME OF EXAMINATION.        Review of Systems     No other overt Review of Systems are noted to be positive except noted in the HPI.      Historical Data   History  Reviewed This Encounter: Medical History  Surgical History  Family History  Social History      Physical Exam   ED Triage Vitals [10/06/23 1347]   BP (Non-Invasive) (!) 148/92   Heart Rate 90   Respiratory Rate 17   Temperature 36.5 C (97.7 F)   SpO2 100 %   Weight 77.1 kg (170 lb)   Height 1.676 m (5' 6)         Nursing notes reviewed for what could be assessed. Past Medical, Surgical, and Social history reviewed for what has been completed.     Constitutional: NAD. Well-Developed. Well Nourished.  AFEBRILE  Head: Normocephalic, atraumatic.  Mouth/Throat: no nasal discharge, posterior pharynx WNL  Eyes: EOM grossly intact, conjunctiva normal.  Cardiovascular: Regular Rate and Rhythm, extremities well perfused.  Pulmonary/Chest: No respiratory distress.  WHEEZING NOTED THROUGHOUT LUNG FIELDS WITHOUT ANY SIGNIFICANT RALES OR RHONCHI APPRECIATED.  Abdominal: Soft, non-tender, non-distended.  MSK: No Lower Extremity Edema.  Skin: Warm, dry, and intact  Neuro: Appropriate, CN II-XII grossly intact   Psych: Cooperative           Procedures      Patient Data     Labs Ordered/Reviewed   MAGNESIUM - Abnormal; Notable for the following components:       Result Value    MAGNESIUM 1.6 (*)     All other components within normal limits   CBC WITH  DIFF - Abnormal; Notable for the following components:    WBC 10.8 (*)     RDW 18.4 (*)     MPV 7.3 (*)     EOSINOPHIL % 10 (*)     MONOCYTE # 1.05 (*)     EOSINOPHIL # 1.05 (*)     All other components within normal limits   PT/INR - Normal    Narrative:     In the setting of warfarin therapy, a moderate-intensity INR goal range is 2.0 to 3.0 and a high-intensity INR goal range is 2.5 to 3.5.    INR is ONLY validated to determine the level of anticoagulation with vitamin K antagonists (warfarin). Other factors may elevate the INR including but not limited to direct oral anticoagulants (DOACs), liver dysfunction, vitamin K deficiency, DIC, factor deficiencies, and factor  inhibitors.   PTT (PARTIAL THROMBOPLASTIN TIME) - Normal   TROPONIN-I - Normal    Narrative:     Values received on females ranging between 12-15 ng/L MUST include the next serial troponin to review changes in the delta differences as the reference range for the Access II chemistry analyzer is lower than the established reference range.     CBC/DIFF    Narrative:     The following orders were created for panel order CBC/DIFF.  Procedure                               Abnormality         Status                     ---------                               -----------         ------                     CBC WITH GNFA[[id number]]                Abnormal            Final result                 Please view results for these tests on the individual orders.   COMPREHENSIVE METABOLIC PANEL, NON-FASTING    Narrative:     Estimated Glomerular Filtration Rate (eGFR) is calculated using the CKD-EPI (2021) equation, intended for patients 78 years of age and older. If gender is not documented or unknown, there will be no eGFR calculation.   B-TYPE NATRIURETIC PEPTIDE (BNP),PLASMA       XR AP MOBILE CHEST   Final Result by Edi, Radresults In (06/15 1421)   Bronchitis. No focal pneumonia.                  Radiologist location ID: [facility]             Medical Decision Making          Medical Decision Making  Amount and/or Complexity of Data Reviewed  Labs: ordered.  Radiology: ordered.  ECG/medicine tests: ordered.    Risk  OTC drugs.  Prescription drug management.          Studies Assessed:  LAB WORK, IMAGING, EKG    EKG:   This EKG interpreted by me shows:    Rate:  100  Interpretation:  NORMAL AXIS, SINUS RHYTHM, RATE 100, NO ST-T WAVE CHANGES      MDM Narrative:  PATIENT PRESENTED TO THE EMERGENCY DEPARTMENT WITH COMPLAINTS OF SHORTNESS OF BREATH.  PATIENT STATES THAT HE HAS CHRONIC OBSTRUCTIVE PULMONARY DISEASE AND ASTHMA.  HE WAS OUT SHOPPING, WHEN HE STARTED GETTING SHORT OF BREATH AND COULD NOT CATCH HIS BREATH.  PATIENT  STATES THAT HE WAS OUT OF HIS NEBULIZER MEDICATION AS WELL AS HIS ALBUTEROL INHALER.  HE CONTACTED EMS FOR TRANSPORT TO THE EMERGENCY DEPARTMENT.  PATIENT DID RECEIVE DUONEB IN ROUTE WITH SIGNIFICANT IMPROVEMENT IN SYMPTOMS.  HOWEVER, PATIENT DOES ALSO ADMIT TO A CLOSE FAMILY MEMBER THAT WAS RECENTLY DIAGNOSED WITH BRONCHITIS.  PATIENT CONTINUES TO SMOKE DAILY, DESPITE HIS CHRONIC OBSTRUCTIVE PULMONARY DISEASE AND MULTIPLE EMERGENCY DEPARTMENT VISITS FOR CHRONIC OBSTRUCTIVE PULMONARY DISEASE EXACERBATIONS.  HE DENIES ANY ASSOCIATED FEVER OR CHILLS.  HE STATES THAT HE HAD SOME CHEST TIGHTNESS WHEN HIS SYMPTOMS BEGAN TODAY, BUT THAT RESOLVED AFTER THE DUONEB.  HE DENIES ANY ABDOMINAL PAIN, SWELLING OF THE LOWER EXTREMITIES OR RECENT CHANGES IN BOWEL OR BLADDER HABITS.  PATIENT DENIES ANY FURTHER COMPLAINTS AT TIME OF EXAMINATION.  PHYSICAL EXAMINATION REVEALED WHEEZING NOTED THROUGHOUT LUNG FIELDS WITHOUT SIGNIFICANT RALES OR RHONCHI APPRECIATED.  HEART RATE AND RHYTHM WITHIN NORMAL LIMITS AT TIME OF EXAMINATION.  ABDOMEN IS SOFT AND NONDISTENDED WITH NO TENDERNESS TO PALPATION.  THERE WAS NO SWELLING OF THE LOWER EXTREMITIES.  POSTERIOR PHARYNX WITHIN NORMAL LIMITS.  PATIENT WAS AFEBRILE.  SOLU-MEDROL WAS ORDERED.  LAB WORK AND IMAGING ORDERED.  PATIENT WAS STABLE.    DUONEB ORDERED      ED Course as of 10/06/23 1525   Sun Oct 06, 2023   1425 WBC 10.8, HEMOGLOBIN 13.0, PLATELET COUNT 211   1426 CHEST X-RAY REVEALED:   FINDINGS:     The heart size is normal.   Hyperventilation and peribronchial thickening.  Bilateral lower lung nipple shadows noted.  No acute airspace consolidation.  No pneumothorax or pleural effusion.     IMPRESSION:  Bronchitis. No focal pneumonia.   1450 SODIUM 137, POTASSIUM 4.1, BUN 10, CREATININE 1.18, GLUCOSE 99, TOTAL BILIRUBIN 0.4, AST 18, ALT 13, TOTAL ALKALINE PHOSPHATASE 114   1450 MAGNESIUM 1.6.  MAGNESIUM OXIDE WAS ORDERED.   1450 TROPONIN 6   1450 PTT 34.6, PT 11.9, INR 1.05   1520 ON  REEXAMINATION, PATIENT WAS RESTING COMFORTABLY AND IN NO APPARENT DISTRESS.  PATIENT STATES THAT HE IS FEELING MUCH BETTER AFTER DUONEB AND SOLU-MEDROL.  REEXAMINATION DOES REVEAL IMPROVEMENT IN WHEEZING.  PATIENT STATES THAT HE IS BACK TO NORMAL AND IS READY TO GO HOME.  HE WAS COUNSELED AND EDUCATED ON LABORATORY AND IMAGING FINDINGS.  ALL QUESTIONS WERE ANSWERED TO SATISFACTION.  NEBULIZER SOLUTION, PREDNISONE AND AZITHROMYCIN WERE PRESCRIBED.  PATIENT WAS COUNSELED AND EDUCATED ON PROPER USE AND MOST COMMON SIDE EFFECTS.  PATIENT WAS ALSO PROVIDED WITH AN ALBUTEROL INHALER TO TAKE WITH HIM PRIOR TO DISCHARGE.  PATIENT WAS COUNSELED AND EDUCATED ON SUPPORTIVE CARE AT HOME, INCLUDING TAKING HIS MEDICATION AND FOLLOWING UP WITH PCP IN THE NEXT 1-2 DAYS TO RECHECK SYMPTOMS.  PATIENT WAS GIVEN VERY CLEAR INSTRUCTIONS TO RETURN TO THE EMERGENCY DEPARTMENT FOR ANY NEW OR WORSENING SYMPTOMS.  PATIENT VERBALIZED UNDERSTANDING.  PATIENT WAS SMILING AND STABLE AT TIME OF DISCHARGE.  ALL QUESTIONS WERE ANSWERED TO SATISFACTION.         Medications Ordered/Administered in the ED   methylPREDNISolone sod succ (SOLU-medrol) 125 mg/2 mL injection (125  mg Intravenous Given 10/06/23 1422)   ipratropium-albuterol 0.5 mg-3 mg(2.5 mg base)/3 mL Solution for Nebulization (3 mL Nebulization Given 10/06/23 1457)   azithromycin (ZITHROMAX) tablet (500 mg Oral Given 10/06/23 1449)   magnesium oxide (MAG-OX) 400mg  (241.3 mg elemental magnesium) tablet (400 mg Oral Given 10/06/23 1451)   albuterol 90 mcg per inhalation oral inhaler - Respiratory to administer (2 Puffs Inhalation Given 10/06/23 1523)   aerochamber w/ flowsignal spacer inhalational device ( Inhalation Given 10/06/23 1523)       Following the history, physical exam, and ED workup, the patient was deemed stable and suitable for discharge. The patient/caregiver was advised to return to the ED for any new or worsening symptoms. Discharge medications, and follow-up instructions were  discussed with the patient/caregiver in detail, who verbalizes understanding. The patient/caregiver is in agreement and is comfortable with the plan of care.    Disposition: Discharged         Current Discharge Medication List        START taking these medications.        Details   azithromycin 250 mg Tablet  Commonly known as: ZITHROMAX  Start taking on: October 07, 2023   250 mg, Oral, Daily  Qty: 4 Tablet  Refills: 0     predniSONE 50 mg Tablet  Commonly known as: DELTASONE  Start taking on: October 07, 2023   50 mg, Oral, Daily  Qty: 5 Tablet  Refills: 0            CONTINUE these medications which have CHANGED during your visit.        Details   * albuterol sulfate 90 mcg/actuation oral inhaler  Commonly known as: PROVENTIL or VENTOLIN or PROAIR  What changed: Another medication with the same name was added. Make sure you understand how and when to take each.   1 puff as needed Inhalation every 4 hrs for 90 days  Refills: 0     * albuterol sulfate 90 mcg/actuation oral inhaler  Commonly known as: PROVENTIL or VENTOLIN or PROAIR  What changed: Another medication with the same name was added. Make sure you understand how and when to take each.   1 Puff, Inhalation  Refills: 0     * albuterol sulfate 2.5 mg /3 mL (0.083 %) nebulizer solution  Commonly known as: PROVENTIL  What changed: You were already taking a medication with the same name, and this prescription was added. Make sure you understand how and when to take each.   2.5 mg, Nebulization, EVERY 4 HOURS PRN  Qty: 300 mL  Refills: 0           * This list has 3 medication(s) that are the same as other medications prescribed for you. Read the directions carefully, and ask your doctor or other care provider to review them with you.                CONTINUE these medications - NO CHANGES were made during your visit.        Details   Accu-Chek Guide test strips Strip  Generic drug: Blood Sugar Diagnostic   Refills: 0     Accu-Chek Softclix Lancets Misc  Generic drug:  Lancets   Use to test blood sugar fingerstick Three times a day fingerstick Three times a day for 90 days  Refills: 0     * Anoro Ellipta 62.5-25 mcg/actuation oral diskus inhaler  Generic drug: umeclidinium-vilanteroL   Inhalation, Daily  Refills: 0     *  Anoro Ellipta 62.5-25 mcg/actuation oral diskus inhaler  Generic drug: umeclidinium-vilanteroL   Refills: 0     Benzonatate 200 mg Capsule  Commonly known as: TESSALON   1 Capsule  Refills: 0     busPIRone 15 mg Tablet  Commonly known as: BUSPAR   Refills: 0     celecoxib 200 mg Capsule  Commonly known as: CeleBREX   1 capsule with food Orally 2 times a day for 90 days  Refills: 0     clonazePAM 1 mg Tablet  Commonly known as: klonoPIN   1 Tablet  Refills: 0     Cymbalta 60 mg Capsule, Delayed Release(E.C.)  Generic drug: DULoxetine   2 cap(s) orally once a day  Refills: 0     gabapentin  300 mg Capsule  Commonly known as: NEURONTIN    300 mg, Oral, 3 TIMES DAILY  Qty: 90 Capsule  Refills: 2     hydrOXYzine pamoate 100 mg Capsule  Commonly known as: VISTARIL   as directed 3 times a day Orally three times a day  Refills: 0     * ipratropium-albuteroL 0.5 mg-3 mg(2.5 mg base)/3 mL nebulizer solution  Commonly known as: DUONEB   3 ml by nebulizer 4 times a day for 90 days  Refills: 0     * ipratropium-albuteroL 20-100 mcg/actuation Mist  Commonly known as: COMBIVENT RESPIMAT   1 Puff, Inhalation  Refills: 0     Levocetirizine 5 mg Tablet  Commonly known as: XYZAL   1 tablet in the evening Orally Once a day for 90 days  Refills: 0     loratadine 10 mg Tablet  Commonly known as: CLARITIN   10 mg, Oral  Refills: 0     MetFORMIN 1,000 mg Tablet  Commonly known as: GLUCOPHAGE   1 tablet with a meal Orally 2 times a day for 90 days  Refills: 0     Mirtazapine 45 mg Tablet  Commonly known as: REMERON   Refills: 0     montelukast 10 mg Tablet  Commonly known as: SINGULAIR   1 Tablet  Refills: 0     * nicotine  14 mg/24 hr Patch 24 hr  Commonly known as: NICODERM CQ    14 mg,  Transdermal, Daily  Qty: 42 Patch  Refills: 0     * nicotine  7 mg/24 hr Patch 24 hr  Commonly known as: NICODERM CQ    7 mg, Transdermal, Daily  Qty: 14 Patch  Refills: 0     polysaccharide iron complex 150 mg iron Capsule  Commonly known as: FERREX 150   Refills: 0     prazosin 2 mg Capsule  Commonly known as: MINIPRESS   Refills: 0     Protonix 40 mg Tablet, Delayed Release (E.C.)  Generic drug: pantoprazole   1 tab(s) orally once a day for 90 days  Refills: 0     rOPINIRole 1 mg Tablet  Commonly known as: REQUIP   1 tab(s) orally 2 times a day for 90 days  Refills: 0     Symbicort 160-4.5 mcg/actuation oral inhaler  Generic drug: budesonide-formoteroL   2 puffs Inhalation Twice a day for 90 days  Refills: 0     tamsulosin 0.4 mg Capsule  Commonly known as: FLOMAX   1 cap(s) orally once a day for 90 days  Refills: 0           * This list has 6 medication(s) that are the same as other medications prescribed for you.  Read the directions carefully, and ask your doctor or other care provider to review them with you.                Follow up:   Ladonna Pickup, NP  [address]  [place] [address]  [phone]    In 1 day      [facility], [facility] - Emergency Department  [address]  [place]  [address]  [phone]    As needed, If symptoms worsen               Clinical Impression   Bronchitis (Primary)   Chronic obstructive pulmonary disease with acute exacerbation (CMS HCC)   Asthma, unspecified asthma severity, unspecified whether complicated, unspecified whether persistent   Tobacco abuse         Current Discharge Medication List        START taking these medications    Details   albuterol sulfate (PROVENTIL) 2.5 mg /3 mL (0.083 %) Inhalation nebulizer solution Take 3 mL (2.5 mg total) by nebulization Every 4 hours as needed for Wheezing  Qty: 300 mL, Refills: 0      azithromycin (ZITHROMAX) 250 mg Oral Tablet Take 1 Tablet (250 mg total) by mouth Daily for 4 days  Qty: 4  Tablet, Refills: 0      predniSONE (DELTASONE) 50 mg Oral Tablet Take 1 Tablet (50 mg total) by mouth Daily for 5 days  Qty: 5 Tablet, Refills: 0               Renne Casa, DO

## 2023-10-06 NOTE — ED Nurses Note (Signed)
 Patient discharged home with family.  AVS reviewed with patient/care giver.  A written copy of the AVS and discharge instructions was given to the patient/care giver. Scripts escribed to preferred pharmacy. Questions sufficiently answered as needed.  Patient/care giver encouraged to follow up with PCP as indicated.  In the event of an emergency, patient/care giver instructed to call 911 or go to the nearest emergency room.

## 2023-10-07 DIAGNOSIS — R079 Chest pain, unspecified: Secondary | ICD-10-CM

## 2023-10-07 LAB — ECG 12 LEAD
Atrial Rate: 100 {beats}/min
Calculated P Axis: 86 degrees
Calculated R Axis: 48 degrees
Calculated T Axis: 73 degrees
PR Interval: 146 ms
QRS Duration: 86 ms
QT Interval: 348 ms
QTC Calculation: 448 ms
Ventricular rate: 100 {beats}/min

## 2023-10-29 ENCOUNTER — Emergency Department
Admission: EM | Admit: 2023-10-29 | Discharge: 2023-10-29 | Disposition: A | Discharge status: Home / Self Care | Attending: FAMILY PRACTICE | Admitting: FAMILY PRACTICE

## 2023-10-29 ENCOUNTER — Emergency Department (HOSPITAL_BASED_OUTPATIENT_CLINIC_OR_DEPARTMENT_OTHER)

## 2023-10-29 ENCOUNTER — Encounter (INDEPENDENT_AMBULATORY_CARE_PROVIDER_SITE_OTHER): Payer: Self-pay | Admitting: NEUROLOGY

## 2023-10-29 ENCOUNTER — Encounter (HOSPITAL_BASED_OUTPATIENT_CLINIC_OR_DEPARTMENT_OTHER): Payer: Self-pay

## 2023-10-29 ENCOUNTER — Ambulatory Visit (INDEPENDENT_AMBULATORY_CARE_PROVIDER_SITE_OTHER): Payer: Self-pay | Admitting: NEUROLOGY

## 2023-10-29 ENCOUNTER — Other Ambulatory Visit: Payer: Self-pay

## 2023-10-29 VITALS — BP 92/59 | HR 106 | Temp 98.1°F | Ht 66.0 in | Wt 171.4 lb

## 2023-10-29 DIAGNOSIS — Z8659 Personal history of other mental and behavioral disorders: Secondary | ICD-10-CM

## 2023-10-29 DIAGNOSIS — S76311A Strain of muscle, fascia and tendon of the posterior muscle group at thigh level, right thigh, initial encounter: Secondary | ICD-10-CM | POA: Insufficient documentation

## 2023-10-29 DIAGNOSIS — F419 Anxiety disorder, unspecified: Secondary | ICD-10-CM | POA: Insufficient documentation

## 2023-10-29 DIAGNOSIS — M545 Low back pain, unspecified: Secondary | ICD-10-CM | POA: Insufficient documentation

## 2023-10-29 DIAGNOSIS — I493 Ventricular premature depolarization: Secondary | ICD-10-CM | POA: Insufficient documentation

## 2023-10-29 DIAGNOSIS — I10 Essential (primary) hypertension: Secondary | ICD-10-CM | POA: Insufficient documentation

## 2023-10-29 DIAGNOSIS — E785 Hyperlipidemia, unspecified: Secondary | ICD-10-CM | POA: Insufficient documentation

## 2023-10-29 DIAGNOSIS — G3184 Mild cognitive impairment, so stated: Secondary | ICD-10-CM | POA: Insufficient documentation

## 2023-10-29 DIAGNOSIS — Z7984 Long term (current) use of oral hypoglycemic drugs: Secondary | ICD-10-CM

## 2023-10-29 DIAGNOSIS — G9340 Encephalopathy, unspecified: Secondary | ICD-10-CM | POA: Insufficient documentation

## 2023-10-29 DIAGNOSIS — S76319A Strain of muscle, fascia and tendon of the posterior muscle group at thigh level, unspecified thigh, initial encounter: Secondary | ICD-10-CM

## 2023-10-29 DIAGNOSIS — E1169 Type 2 diabetes mellitus with other specified complication: Secondary | ICD-10-CM | POA: Insufficient documentation

## 2023-10-29 DIAGNOSIS — X58XXXA Exposure to other specified factors, initial encounter: Secondary | ICD-10-CM | POA: Insufficient documentation

## 2023-10-29 DIAGNOSIS — I951 Orthostatic hypotension: Secondary | ICD-10-CM | POA: Insufficient documentation

## 2023-10-29 DIAGNOSIS — F17210 Nicotine dependence, cigarettes, uncomplicated: Secondary | ICD-10-CM | POA: Insufficient documentation

## 2023-10-29 DIAGNOSIS — M51372 Other intervertebral disc degeneration, lumbosacral region with discogenic back pain and lower extremity pain: Secondary | ICD-10-CM

## 2023-10-29 DIAGNOSIS — F32A Depression, unspecified: Secondary | ICD-10-CM | POA: Insufficient documentation

## 2023-10-29 DIAGNOSIS — E1142 Type 2 diabetes mellitus with diabetic polyneuropathy: Secondary | ICD-10-CM

## 2023-10-29 DIAGNOSIS — G252 Other specified forms of tremor: Secondary | ICD-10-CM | POA: Insufficient documentation

## 2023-10-29 DIAGNOSIS — G8929 Other chronic pain: Secondary | ICD-10-CM | POA: Insufficient documentation

## 2023-10-29 LAB — COMPREHENSIVE METABOLIC PANEL, NON-FASTING
ALBUMIN/GLOBULIN RATIO: 0.9 (ref 0.8–1.4)
ALBUMIN: 3.1 g/dL — ABNORMAL LOW (ref 3.4–5.0)
ALKALINE PHOSPHATASE: 81 U/L (ref 46–116)
ALT (SGPT): 18 U/L (ref <=78)
ANION GAP: 8 mmol/L (ref 4–13)
AST (SGOT): 6 U/L — ABNORMAL LOW (ref 15–37)
BILIRUBIN TOTAL: 0.4 mg/dL (ref 0.2–1.0)
BUN/CREA RATIO: 18
BUN: 20 mg/dL — ABNORMAL HIGH (ref 7–18)
CALCIUM, CORRECTED: 9.7 mg/dL
CALCIUM: 9 mg/dL (ref 8.5–10.1)
CHLORIDE: 102 mmol/L (ref 98–107)
CO2 TOTAL: 25 mmol/L (ref 21–32)
CREATININE: 1.12 mg/dL (ref 0.70–1.30)
ESTIMATED GFR: 75 mL/min/1.73mˆ2 (ref >59)
GLOBULIN: 3.5
GLUCOSE: 117 mg/dL — ABNORMAL HIGH (ref 74–106)
OSMOLALITY, CALCULATED: 274 mosm/kg (ref 270–290)
POTASSIUM: 4.1 mmol/L (ref 3.5–5.1)
PROTEIN TOTAL: 6.6 g/dL (ref 6.4–8.2)
SODIUM: 135 mmol/L — ABNORMAL LOW (ref 136–145)

## 2023-10-29 LAB — CBC
HCT: 38.3 % (ref 36.7–47.1)
HGB: 12.8 g/dL (ref 12.5–16.3)
MCH: 31.3 pg (ref 23.8–33.4)
MCHC: 33.3 g/dL (ref 32.5–36.3)
MCV: 93.8 fL (ref 73.0–96.2)
MPV: 7.7 fL (ref 7.4–11.4)
PLATELETS: 209 x10ˆ3/uL (ref 140–440)
RBC: 4.08 x10ˆ6/uL (ref 4.06–5.63)
RDW: 18 % — ABNORMAL HIGH (ref 12.1–16.2)
WBC: 10.6 x10ˆ3/uL — ABNORMAL HIGH (ref 3.6–10.2)

## 2023-10-29 LAB — URINALYSIS, MICROSCOPIC

## 2023-10-29 LAB — URINALYSIS, MACRO/MICRO
BILIRUBIN: NEGATIVE mg/dL
BLOOD: NEGATIVE mg/dL
GLUCOSE: NEGATIVE mg/dL
KETONES: NEGATIVE mg/dL
LEUKOCYTES: NEGATIVE WBCs/uL
NITRITE: NEGATIVE
PH: 6 (ref 4.6–8.0)
PROTEIN: 30 mg/dL — AB
SPECIFIC GRAVITY: >=1.03 (ref 1.003–1.035)
UROBILINOGEN: 0.2 mg/dL (ref 0.2–1.0)

## 2023-10-29 LAB — DRUG SCREEN, NO CONFIRMATION, URINE
AMPHETAMINES URINE: NEGATIVE
BARBITURATES URINE: NEGATIVE
BENZODIAZEPINES URINE: NEGATIVE
CANNABINOIDS URINE: NEGATIVE
COCAINE METABOLITES URINE: NEGATIVE
METHADONE URINE: NEGATIVE
OPIATES URINE: NEGATIVE
PCP URINE: NEGATIVE

## 2023-10-29 LAB — PT/INR
INR: 1.05 (ref 0.84–1.10)
PROTHROMBIN TIME: 11.9 s (ref 9.8–12.7)

## 2023-10-29 LAB — MAGNESIUM: MAGNESIUM: 1.4 mg/dL — ABNORMAL LOW (ref 1.8–2.4)

## 2023-10-29 LAB — TROPONIN-I: TROPONIN I: 5 ng/L (ref <20)

## 2023-10-29 LAB — PTT (PARTIAL THROMBOPLASTIN TIME): APTT: 28.5 s (ref 25.0–38.0)

## 2023-10-29 MED ORDER — MAGNESIUM SULFATE 1 GRAM/100 ML IN DEXTROSE 5 % INTRAVENOUS PIGGYBACK
INJECTION | INTRAVENOUS | Status: AC
Start: 2023-10-29 — End: 2023-10-29
  Filled 2023-10-29: qty 100

## 2023-10-29 MED ORDER — MAGNESIUM SULFATE 1 GRAM/100 ML IN DEXTROSE 5 % INTRAVENOUS PIGGYBACK
1.0000 g | INJECTION | INTRAVENOUS | Status: AC
Start: 2023-10-29 — End: 2023-10-29
  Administered 2023-10-29 (×3): 1 g via INTRAVENOUS
  Administered 2023-10-29 (×2): 0 g via INTRAVENOUS

## 2023-10-29 MED ORDER — GABAPENTIN 300 MG CAPSULE
300.0000 mg | ORAL_CAPSULE | Freq: Three times a day (TID) | ORAL | 2 refills | Status: AC
Start: 2023-10-29 — End: ?

## 2023-10-29 MED ORDER — LACTATED RINGERS IV BOLUS
1000.0000 mL | INJECTION | Status: AC
Start: 2023-10-29 — End: 2023-10-29
  Administered 2023-10-29: 0 mL via INTRAVENOUS
  Administered 2023-10-29: 1000 mL via INTRAVENOUS

## 2023-10-29 MED ORDER — MAGNESIUM SULFATE 1 GRAM/100 ML IN DEXTROSE 5 % INTRAVENOUS PIGGYBACK
INJECTION | INTRAVENOUS | Status: AC
Start: 2023-10-29 — End: 2023-10-29
  Filled 2023-10-29: qty 200

## 2023-10-29 MED ORDER — LACTATED RINGERS IV BOLUS
1000.0000 mL | INJECTION | Status: DC
Start: 2023-10-29 — End: 2023-10-29
  Administered 2023-10-29: 0 mL via INTRAVENOUS
  Administered 2023-10-29: 1000 mL via INTRAVENOUS
  Administered 2023-10-29: 0 mL via INTRAVENOUS

## 2023-10-29 NOTE — ED Provider Notes (Signed)
 [facility] [place] - Emergency Department  ED Primary Note  History of Present Illness   Nathan Hopkins is a 63 y.o. male who had concerns including Hypotension.  This 63 year old male patient presents emergency department via EMS with hypotension, while undergoing a tilt table test at a provider's office.  Patient has a arthritis and upper cervical foraminal narrowing.  He is having some pain down the posterior aspect of the right leg stopping at the knee, formerly had pain down the anterior aspect of the right thigh, which has now switched to the posterior aspect of the thigh.  He has had no falls, heavy lifting, or accidents that he can recall.    Past medical history:  Iron deficiency anemia, Chronic Obstructive Pulmonary Disease, essential hypertension, diabetes mellitus type 2, psoriasis, depression and anxiety.  Past surgical history: Appendectomy, hip surgery with replacement, dental surgery.    Social history:  Smokes nearly a pack a day, formerly Advertising account planner.  No current alcohol or marijuana use and no illicit drug use.    Physical Exam   ED Triage Vitals [10/29/23 1150]   BP (Non-Invasive) (!) 83/49   Heart Rate 92   Respiratory Rate 16   Temperature (!) 35.8 C (96.4 F)   SpO2 92 %   Weight 77.6 kg (171 lb)   Height 1.676 m (5' 6)     Physical Exam  General: No acute distress nontoxic, alert and oriented to person, place, time and events.    Ears: TMs clear throat for traction   Nasal:  Mild injection, patent bilaterally, moist   Oral: Pink and moist   Pharynx: Injected without PND, exudate, petechiae   Neck: Supple, trachea is in midline, no carotid bruits.    Lungs: Clear symmetrical bilaterally with good aeration   Heart: Regular rate rhythm without murmur   Abdomen: Soft, normal bowel sounds nontender   Lower extremities: Moving symmetrically, good light touch sensation and strengths are symmetrical.    Lower extremities:  Moving symmetrically, tight hamstrings and calf muscles  of the proximally 60-70 degrees straight-leg raising.  Touch sensation in the anterior, medial, lateral and posterior aspects of the legs, DTRs are 2/4 and symmetrical.  Strengths are symmetrical.    Suspicious rashes, lesions, dependent edema.    Patient Data   Labs Ordered/Reviewed   COMPREHENSIVE METABOLIC PANEL, NON-FASTING - Abnormal; Notable for the following components:       Result Value    SODIUM 135 (*)     BUN 20 (*)     ALBUMIN 3.1 (*)     GLUCOSE 117 (*)     AST (SGOT) 6 (*)     All other components within normal limits    Narrative:     Estimated Glomerular Filtration Rate (eGFR) is calculated using the CKD-EPI (2021) equation, intended for patients 30 years of age and older. If gender is not documented or unknown, there will be no eGFR calculation.   CBC - Abnormal; Notable for the following components:    WBC 10.6 (*)     RDW 18.0 (*)     All other components within normal limits   MAGNESIUM  - Abnormal; Notable for the following components:    MAGNESIUM  1.4 (*)     All other components within normal limits   URINALYSIS, MACRO/MICRO - Abnormal; Notable for the following components:    PROTEIN 30 (*)     All other components within normal limits   URINALYSIS, MICROSCOPIC - Abnormal; Notable for the  following components:    BACTERIA Rare (*)     All other components within normal limits   PT/INR - Normal    Narrative:     In the setting of warfarin therapy, a moderate-intensity INR goal range is 2.0 to 3.0 and a high-intensity INR goal range is 2.5 to 3.5.    INR is ONLY validated to determine the level of anticoagulation with vitamin K antagonists (warfarin). Other factors may elevate the INR including but not limited to direct oral anticoagulants (DOACs), liver dysfunction, vitamin K deficiency, DIC, factor deficiencies, and factor inhibitors.   TROPONIN-I - Normal    Narrative:     Values received on females ranging between 12-15 ng/L MUST include the next serial troponin to review changes in the  delta differences as the reference range for the Access II chemistry analyzer is lower than the established reference range.     PTT (PARTIAL THROMBOPLASTIN TIME) - Normal   DRUG SCREEN, NO CONFIRMATION, URINE - Normal   URINALYSIS WITH REFLEX MICROSCOPIC AND CULTURE IF POSITIVE    Narrative:     The following orders were created for panel order URINALYSIS WITH REFLEX MICROSCOPIC AND CULTURE IF POSITIVE.  Procedure                               Abnormality         Status                     ---------                               -----------         ------                     URINALYSIS, MACRO/MICRO[[id number]]      Abnormal            Final result               URINALYSIS, MICROSCOPIC[[id number]]      Abnormal            Final result                 Please view results for these tests on the individual orders.     No orders to display     Medical Decision Making        Medical Decision Making  Moderate complexity, differential includes electrolyte imbalance, dehydration, peripheral autonomic neuropathy, vasovagal syndrome, renal failure, hypo magnesemia, cervical disc disease with right leg paresthesias and weakness.    Amount and/or Complexity of Data Reviewed  Labs: ordered.     Details: Labs reviewed and noted, magnesium  is 1.4 being replaced with 3 g of magnesium   ECG/medicine tests: ordered.     Details: EKG reviewed and noted.    Risk  Prescription drug management.                Medications Ordered/Administered in the ED   magnesium  sulfate 1 G in D5W 100 mL premix IVPB (0 g Intravenous Stopped 10/29/23 1455)   LR bolus infusion 1,000 mL (0 mL Intravenous Stopped 10/29/23 1401)     Clinical Impression   Orthostatic hypotension (Primary)   Hypomagnesemia   Essential hypertension   Type 2 diabetes mellitus with other specified complication, without long-term current use of insulin  Anxiety   History of depression   Hyperlipidemia, unspecified hyperlipidemia type   Chronic low back pain   Hamstring strain        Disposition: Discharged

## 2023-10-29 NOTE — Progress Notes (Signed)
 NEUROLOGY, [facility]  [address]  [place] [address]      ASSESSMENT/PLAN  Type 2 diabetes mellitus with peripheral neuropathy (CMS [facility]):   Patient reports numbness and burning sensation in feet, consistent with diabetic neuropathy. The symptoms are bothersome enough for the patient to consider treatment. Last A1C result is not available in the current records.  - Glucose control per primary  - Conservative measures and foot monitoring counseling provided  - Continue gabapentin  300 mg TID  - RTC 4 months    Memory changes:   Encephalopathy:  Shaking Episodes:  Patient reports that he had an episode of confusion recently associated with pneumonia. Otherwise he denies significant memory loss. MOCA reassuring. He continues to have episodes of confusion often surrounding episodes of lightheadedness and pre-syncope possibly associated with hypotension, especially since these episodes started around when he started the losartan (and he's usually hypertensive). EEG was normal. Video provided by wife of his confusional episodes and shaking were definitely nonepileptic, so almost no concern for seizure being etiology. Orthostatic vitals were negative, but did drop to systolic of 89.  - Patient nearly fainted while in office with an orthostatic tremor and blurred/darkened vision and was sent to the ED as he felt he was about to pass out  - Counseled to take blood pressure twice a day for 2 weeks  - Instructed to speak to PCP about BP meds  - Stay well hydrated, avoid caffeinated drinks/high sugar drinks  - Be careful with positional changes.  - PT/OT referral to [facility]    Thank you for allowing me to participate in your patient's care and please do not hesitate to contact me for any questions or concerns.    Garnette Hard, MD  Assistant Professor of Neurology    [facility]    On the day of the encounter, a total of 52 minutes was spent on this  patient encounter including review of historical information, examination, documentation and post-visit activities. The time documented excludes procedural time.    [id number]: I will continue to be the provider focal point in managing the chronic complex neurological condition   =====================================================================    NAME:  Nathan Hopkins  DOB:  [birth year]  VISIT DATE:  10/29/2023     CC:  Confusion and neuropathy    Patient seen in consultation at the request of Earnie Shams, NP  History obtained from the patient and chart/records  Age of patient:  63 y.o.    I had the pleasure of seeing Nathan Hopkins for outpatient consultation, who is a 63 y.o. year old male and is being seen for management of above CC.    INTERVAL HISTORY:     10/29/2023   Nathan Hopkins, a 63yo male, presented with multiple episodes of encephalopathy, shaking, and presyncope since April. His history includes PTSD and sciatic nerve pain. Examination revealed hypotension (92/59, which dropped to 89/59 with orthostatic intolerance and orthostatic tremor) and tachycardia (106). Symptoms were attributed to orthostatic intolerance, likely exacerbated by Losartan, dehydration, and caffeine intake. Management included discontinuing/reducing Losartan, increasing hydration, avoiding caffeine, physical therapy referral, and adjusting Gabapentin  dosing (1 morning, 2 night). Patient was sent to the ED due to acute presyncope during the visit.     HPI:    08/08/2023  63 year old male with a history of pneumonia in November, presents for follow-up of dizziness, confusion, and an MRI. The patient reports ongoing dizziness, which he describes as feeling like he's about to pass  out rather than a room-spinning sensation. This lightheadedness is likely related to blood pressure issues.    Patient mentions having neuropathy in his feet, which he attributes to his diabetes. He describes the neuropathy as being particularly bothersome when he goes to  bed. The patient has a history of COPD and asthma, and has been hospitalized approximately 8 times for breathing problems. He reports that these respiratory issues began after moving from [place]  to his current location, which he suspects may be related to environmental factors such as air quality and standing water under his house.    Patient is a current smoker, consuming about half a pack of cigarettes per day. He has attempted to quit smoking in the past using nicotine  patches but has been unsuccessful. He is now considering trying again with nicotine  replacement therapy.    Patient denies any problems with memory. He demonstrates good recall during the visit, remembering words from earlier in the conversation.    Medical History  - Diabetes with diabetic neuropathy  - COPD  - Asthma  - Severe pneumonia and infection in November  - Dizziness episode on November 20th  - Hypertension    =====================================================================  PMHx  Patient Active Problem List   Diagnosis    Iron deficiency anemia    Type 2 diabetes mellitus with peripheral neuropathy (CMS HCC)     Past Surgical History:   Procedure Laterality Date    DENTAL SURGERY      HIP SURGERY      artifical hip    HX APPENDECTOMY       Family Medical History:       Problem Relation (Age of Onset)    Alzheimer's/Dementia Mother    Lung Cancer Father          Current Outpatient Medications   Medication Sig Dispense Refill    ACCU-CHEK GUIDE TEST STRIPS Does not apply Strip       ACCU-CHEK SOFTCLIX LANCETS Misc Use to test blood sugar fingerstick Three times a day fingerstick Three times a day for 90 days      albuterol  sulfate (PROVENTIL  OR VENTOLIN  OR PROAIR ) 90 mcg/actuation Inhalation oral inhaler Take 1 Puff by inhalation      albuterol  sulfate (PROVENTIL  OR VENTOLIN  OR PROAIR ) 90 mcg/actuation Inhalation oral inhaler 1 puff as needed Inhalation every 4 hrs for 90 days      albuterol  sulfate (PROVENTIL ) 2.5 mg /3 mL (0.083 %)  Inhalation nebulizer solution Take 3 mL (2.5 mg total) by nebulization Every 4 hours as needed for Wheezing 300 mL 0    ANORO ELLIPTA 62.5-25 mcg/actuation Inhalation oral diskus inhaler       Benzonatate (TESSALON) 200 mg Oral Capsule 1 Capsule (200 mg total)      busPIRone (BUSPAR) 15 mg Oral Tablet       celecoxib (CELEBREX) 200 mg Oral Capsule 1 capsule with food Orally 2 times a day for 90 days      clonazePAM (KLONOPIN) 1 mg Oral Tablet 1 Tablet (1 mg total)      CYMBALTA 60 mg Oral Capsule, Delayed Release(E.C.) 2 cap(s) orally once a day      gabapentin  (NEURONTIN ) 300 mg Oral Capsule Take 1 Capsule (300 mg total) by mouth Three times a day 90 Capsule 2    hydrOXYzine pamoate (VISTARIL) 100 mg Oral Capsule as directed 3 times a day Orally three times a day      ipratropium-albuteroL  (COMBIVENT RESPIMAT) 20-100 mcg/actuation Inhalation Mist Take 1 Puff  by inhalation      ipratropium-albuterol  0.5 mg-3 mg(2.5 mg base)/3 mL Solution for Nebulization 3 ml by nebulizer 4 times a day for 90 days      Levocetirizine (XYZAL) 5 mg Oral Tablet 1 tablet in the evening Orally Once a day for 90 days      loratadine (CLARITIN) 10 mg Oral Tablet Take 1 Tablet (10 mg total) by mouth      MetFORMIN (GLUCOPHAGE) 1,000 mg Oral Tablet 1 tablet with a meal Orally 2 times a day for 90 days      Mirtazapine (REMERON) 45 mg Oral Tablet       montelukast (SINGULAIR) 10 mg Oral Tablet 1 Tablet (10 mg total)      nicotine  (NICODERM CQ ) 14 mg/24 hr Transdermal Patch 24 hr Place 1 Patch (14 mg total) on the skin Daily 42 Patch 0    nicotine  (NICODERM CQ ) 7 mg/24 hr Transdermal Patch 24 hr Place 1 Patch (7 mg total) on the skin Daily 14 Patch 0    polysaccharide iron complex (FERREX 150) 150 mg iron Oral Capsule       prazosin (MINIPRESS) 2 mg Oral Capsule       PROTONIX 40 mg Oral Tablet, Delayed Release (E.C.) 1 tab(s) orally once a day for 90 days      rOPINIRole (REQUIP) 1 mg Oral Tablet 1 tab(s) orally 2 times a day for 90 days       SYMBICORT 160-4.5 mcg/actuation Inhalation oral inhaler 2 puffs Inhalation Twice a day for 90 days      tamsulosin (FLOMAX) 0.4 mg Oral Capsule 1 cap(s) orally once a day for 90 days      umeclidinium-vilanteroL (ANORO ELLIPTA) 62.5-25 mcg/actuation Inhalation oral diskus inhaler Take by inhalation Once a day       No current facility-administered medications for this visit.     No Known Allergies  Social History     Socioeconomic History    Marital status: Divorced     Spouse name: Not on file    Number of children: Not on file    Years of education: Not on file    Highest education level: Not on file   Occupational History    Not on file   Tobacco Use    Smoking status: Every Day     Types: Cigarettes    Smokeless tobacco: Never   Vaping Use    Vaping status: Former   Substance and Sexual Activity    Alcohol use: Not Currently    Drug use: Not Currently    Sexual activity: Not on file   Other Topics Concern    Not on file   Social History Narrative    Not on file     Social Determinants of Health     Financial Resource Strain: Not on file   Transportation Needs: Not on file   Social Connections: Not on file   Intimate Partner Violence: Not on file   Housing Stability: Not on file     =====================================================================  GENERAL EXAMINATION  BP (!) 92/59   Pulse (!) 106   Temp 36.7 C (98.1 F)   Ht 1.676 m (5' 6)   Wt 77.7 kg (171 lb 6.4 oz)   SpO2 94%   BMI 27.66 kg/m     Vital signs personally reviewed    General: No acute distress, alert  HEENT: Normocephalic, no scleral icterus  Pulmonary: No accessory muscle use, no tachypnea  Cardiovascular: Heart with  regular rate & rhythm  Extremities: No significant edema, No cyanosis    NEUROLOGIC EXAM  MENTAL STATUS: alert and oriented to person/place/time/situation; speech clear and fluent with good repetition and comprehension; naming intact; attention/concentration normal; recent and remote memory intact with normal fund of  knowledge; able to follow simple and complex axial and appendicular commands without L/R confusion. MOCA 25/30.    CN  II: not directly tested, grossly intact  III, IV, VI: extraocular movements intact without nystagmus  V: intact to light touch  VII: face symmetric without weakness  VIII: grossly intact  IX, X: symmetric palatal elevation  XI: normal strength of trapezius and sternocleidomastoid bilaterally  XII: tongue midline with full movements    MOTOR  Bulk: normal  Tone: normal  Abnormal Movements: none  Fasciculations: none    Strength: Patient moving all extremities symmetrically and against gravity with good strength.    Reflexes: 1+ throughout    Sensory: decreased sensation of all modalities to bilateral feet    Coordination: No dysmetria    Gait: Normal    =====================================================================  DATA  Personal review of prior labs is notable for:   TSH   Date Value Ref Range Status   09/04/2023 1.530 0.450 - 5.330 uIU/mL Final     Personal review of imaging (with independent interpretation) is notable for:   - CT head 04/05/2023:  Report only, No acute intracranial abnormality.   - CT head 09/02/2023 report only: No acute intracranial abnormality. Small vessel disease and atrophy.  - CT C-spine 09/02/2023 report only: No acute cervical spine fracture or dislocation. Extensive degenerative changes noted.  Personal Review of other prior diagnostics is notable for:   - EEG 09/19/23: normal routine  =====================================================================    Orders  Orders Placed This Encounter    Referral to PHYSICAL/OCCUPATIONAL THERAPY - External    gabapentin  (NEURONTIN ) 300 mg Oral Capsule

## 2023-10-29 NOTE — ED Nurses Note (Signed)

## 2023-10-29 NOTE — Discharge Instructions (Addendum)
 Your low blood pressure could have been due to mild dehydration, low magnesium  level, or if you were doing of the formal 45 minute until table test over the age of 75 or 58 we can have drops in her blood pressure with this type of slow positional change.  You were given 3 g of magnesidum for your 1.4 magnesium  level, also 1 L of lactated Ringer's IV fluids for hydration.    Call follow up with your primary care provider, continue home medications as prescribed, and get the physical therapy for your back, hamstrings and calves which can improve your back pain,

## 2023-10-29 NOTE — Progress Notes (Signed)
 Patient nearly fainted while in office with an orthostatic tremor and blurred vision and was sent to the ED as he felt he was about to pass out.    He is usually hypertensive. His blood pressure dropped to 89/59.    He was provided a cup of water at the beginning of the encounter which did not help even into the encounter.    Due to orthostatic tremor, blurred vision, and presyncope EMS was called.

## 2023-10-30 DIAGNOSIS — I493 Ventricular premature depolarization: Secondary | ICD-10-CM

## 2023-10-30 LAB — ECG 12 LEAD
Atrial Rate: 91 {beats}/min
Calculated P Axis: 66 degrees
Calculated R Axis: 61 degrees
Calculated T Axis: 73 degrees
PR Interval: 148 ms
QRS Duration: 84 ms
QT Interval: 358 ms
QTC Calculation: 440 ms
Ventricular rate: 91 {beats}/min

## 2023-10-30 NOTE — H&P (Signed)
 PULMONARY, [facility]  [address]  [place] [address]  Operated by [facility]     New Patient Sleep Note    Patient Name: Nathan Hopkins  Date: 10/31/2023  Department:  PULMONARY, [facility]  MRN: [id number]  DOB: [birth year]  Primary Care Provider:  Earnie VEAR Shams, NP  Referring Provider:  No ref. provider found    Chief Complaint:   Chief Complaint   Patient presents with    Establish Care     Pt presents to establish sleep and pulmonary care. He was referred to the clinic by his PCP due to history of COPD, asthma and suspected OSA. He does use 2L/min oxygen. DME is [facility] in [place], [place]. Previous pt of Dr. Figueroa. He c/o difficulty staying asleep, snoring, gasping in his sleep and waking with sore throat.          HPI:     Nathan Hopkins is a 63 y.o., White male presents today to establish care for sleep and pulmonary care.  Referred due to history of COPD, asthma, and needing sleep testing.  Patient has had symptoms including excessive daytime sleepiness, snoring, gasping, non-restorative sleep, fatigue, and witnessed apneas.     Epworth assessment done in office today with a total score of 17.    Frequent shortness of breath with any exertion.  Had Anoro ordered but is not taking it.  Cost too much and did not work that well.  Using Duoneb neb treatment and albuterol  inhaler.  Reports neb helped more that inhaler.      Current smoker.  Has nicotine  patches already ordered and planning on quitting, but is not quite ready yet.      Uses oxygen at 2 LPM at nights.        Past Medical history  Past Medical History:   Diagnosis Date    Anemia     Anxiety state     Asthma     COPD (chronic obstructive pulmonary disease)     Depression     Diabetes mellitus, type 2     Essential hypertension     Inguinal hernia     Iron deficiency     Psoriasis and similar disorders     ears and back of head     Past Surgical History  Past Surgical History:    Procedure Laterality Date    DENTAL SURGERY      x2    HIP SURGERY      artifical hip    HX APPENDECTOMY       Medication List  Current Outpatient Medications   Medication Sig    ACCU-CHEK GUIDE TEST STRIPS Does not apply Strip     ACCU-CHEK SOFTCLIX LANCETS Misc Use to test blood sugar fingerstick Three times a day fingerstick Three times a day for 90 days    albuterol  sulfate (PROVENTIL  OR VENTOLIN  OR PROAIR ) 90 mcg/actuation Inhalation oral inhaler Take 1 Puff by inhalation    albuterol  sulfate (PROVENTIL  OR VENTOLIN  OR PROAIR ) 90 mcg/actuation Inhalation oral inhaler 1 puff as needed Inhalation every 4 hrs for 90 days    albuterol  sulfate (PROVENTIL ) 2.5 mg /3 mL (0.083 %) Inhalation nebulizer solution Take 3 mL (2.5 mg total) by nebulization Every 4 hours as needed for Wheezing    arformoteroL  (BROVANA ) 15 mcg/2 mL Inhalation Solution for Nebulization Take 2 mL (15 mcg total) by nebulization Twice daily    Benzonatate (TESSALON) 200 mg Oral Capsule 1 Capsule (200  mg total)    budesonide  (PULMICORT  RESPULES) 0.5 mg/2 mL Inhalation nebulizer suspension Take 2 mL (0.5 mg total) by nebulization Twice daily Mix with Brovana  in neb.  Rinse mouth after each use of inhaled steroid.    busPIRone (BUSPAR) 15 mg Oral Tablet     celecoxib (CELEBREX) 200 mg Oral Capsule 1 capsule with food Orally 2 times a day for 90 days    CYMBALTA 60 mg Oral Capsule, Delayed Release(E.C.) 2 cap(s) orally once a day    gabapentin  (NEURONTIN ) 300 mg Oral Capsule Take 1 Capsule (300 mg total) by mouth Three times a day    guaiFENesin  (MUCINEX ) 600 mg Oral Tablet Extended Release 12hr Take 1 Tablet (600 mg total) by mouth Every 12 hours    ipratropium-albuterol  0.5 mg-3 mg(2.5 mg base)/3 mL Solution for Nebulization 3 ml by nebulizer 4 times a day for 90 days    Levocetirizine (XYZAL) 5 mg Oral Tablet 1 tablet in the evening Orally Once a day for 90 days    loratadine (CLARITIN) 10 mg Oral Tablet Take 1 Tablet (10 mg total) by mouth     losartan (COZAAR) 25 mg Oral Tablet Take 1 Tablet (25 mg total) by mouth Daily    MetFORMIN (GLUCOPHAGE) 1,000 mg Oral Tablet 1 tablet with a meal Orally 2 times a day for 90 days    montelukast (SINGULAIR) 10 mg Oral Tablet 1 Tablet (10 mg total)    nicotine  (NICODERM CQ ) 14 mg/24 hr Transdermal Patch 24 hr Place 1 Patch (14 mg total) on the skin Daily (Patient not taking: Reported on 10/31/2023)    nicotine  (NICODERM CQ ) 7 mg/24 hr Transdermal Patch 24 hr Place 1 Patch (7 mg total) on the skin Daily (Patient not taking: Reported on 10/31/2023)    OZEMPIC 1 mg/dose (4 mg/3 mL) Subcutaneous Pen Injector Inject 1 mg under the skin Every 7 days    polysaccharide iron complex (FERREX 150) 150 mg iron Oral Capsule     prazosin (MINIPRESS) 2 mg Oral Capsule     predniSONE  (DELTASONE ) 10 mg Oral Tablet Take 1 Tablet (10 mg total) by mouth Three times a day for 3 days, THEN 1 Tablet (10 mg total) Twice daily for 3 days, THEN 1 Tablet (10 mg total) Daily for 4 days.    PROTONIX 40 mg Oral Tablet, Delayed Release (E.C.) 1 tab(s) orally once a day for 90 days    rOPINIRole (REQUIP) 1 mg Oral Tablet 1 tab(s) orally 2 times a day for 90 days    rosuvastatin (CRESTOR) 5 mg Oral Tablet Take 1 Tablet (5 mg total) by mouth Daily    tamsulosin (FLOMAX) 0.4 mg Oral Capsule 1 cap(s) orally once a day for 90 days     Allergy List  Allergy History as of 11/10/23        No Known Allergies                  Family History   Family Medical History:       Problem Relation (Age of Onset)    Alzheimer's/Dementia Mother    Lung Cancer Father            Social History  Social History     Socioeconomic History    Marital status: Divorced     Spouse name: Not on file    Number of children: Not on file    Years of education: Not on file    Highest education level: Not on  file   Occupational History    Not on file   Tobacco Use    Smoking status: Every Day     Current packs/day: 1.00     Average packs/day: 2.3 packs/day for 39.5 years (91.5 ttl  pk-yrs)     Types: Cigarettes     Start date: 1     Passive exposure: Past    Smokeless tobacco: Former     Types: Chew, Snuff   Vaping Use    Vaping status: Former   Substance and Sexual Activity    Alcohol use: Not Currently    Drug use: Not Currently    Sexual activity: Not on file   Other Topics Concern    Not on file   Social History Narrative    Not on file     Social Determinants of Health     Financial Resource Strain: Not on file   Transportation Needs: Not on file   Social Connections: Not on file   Intimate Partner Violence: Not on file   Housing Stability: Not on file       Review of system  General:  Denies fever, chills, night sweats, loss of appetite.  Neurological:  Denies dizziness or seizures.  Gastrointestinal:  Denies uncontrolled reflux, heartburn, or diarrhea.  Cardiovascular:  Denies chest pain or irregular heartbeats.  Respiratory: see HPI  Musculoskeletal:  Denies uncontrolled joint pain or restless legs.  Endocrine/Metabolic:  Denies weight gain or weight loss.  Mental Status/Psychiatric:  Denies uncontrolled anxiety or depression.  Integumentary:  Denies rash or new abnormal skin lesions.    Vital Signs  Vitals:    10/31/23 1109   BP: (!) 150/79   Pulse: 95   Resp: 20   Temp: 36.6 C (97.9 F)   TempSrc: Oral   SpO2: 94%   Weight: 77.1 kg (170 lb)   Height: 1.676 m (5' 6)   BMI: 27.44     Neck (Inches): 17.5    PHYSICAL EXAMINATION:   Constitutional:  Vital signs stable.  General appearance of the patient:  Alert, no acute distress.  Normal appearance, well nourished.  Eyes:  Normal eye lids.  Conjunctiva normal.  Ears, Nose, Mouth, and Throat: External inspection of ears and nose with normal appearance.  Nares patent.  Neck: Supple with trachea midline, non tender, no nodules, no masses, gland position midline.  Respiratory:  Auscultation of lungs with distant breath sounds with scattered rhonchi. Mild end expiratory wheezing.  Respiratory effort with no tractions, breathing regular  and unlabored.  Cardiovascular:  Regular rhythm and regular rate.  No murmur, no peripheral edema.  Musculoskeletal:  Normal gait and station, normal digits, no digital cyanosis or clubbing.  Mental Status/Psychiatric:  Alert- oriented to person, place, and time.  Appropriate and normal mood.    Assessment & Plan  Chronic obstructive pulmonary disease, unspecified COPD type (CMS HCC)         Acute bronchitis, unspecified organism         OSA (obstructive sleep apnea)    Orders:    POLYSOMNOGRAPHY-ANP OVERNIGHT-1ST NIGHT OF STUDY (F/U WITH CPAP IF ANP MEETS CLINICAL CRITERIA); Future    Nocturnal hypoxemia         Tobacco use    Orders:    CT LUNG CANCER SCREENING PROGRAM BASELINE; Future    Type 2 diabetes mellitus with peripheral neuropathy (CMS HCC)         Instructions  This patient will need to have testing with polysomnogram for  sleep apnea.  In lab required due to multiple comorbidities including COPD, asthma, diabetes, and nocturnal hypoxemia.   Test is ordered and will be scheduled.      Order for low dose chest CT scan to be scheduled and done due to patient being high risk for cancer due to tobacco use.      Start Brovana  and Budesonide  nebulized treatments every 12 hours.  Continue Duoneb as directed.      Course of prednisone  ordered to be taken.      Start Mucinex  600 mg twice daily.      Continue medications as prescribed/directed unless changed by provider.    Plan of care discussed with patient.    Return in about 2 months (around 01/01/2024). Patient was advised to come back earlier if any symptoms get worse.  Also advised to come back for results of any test done.  If unable to keep regular appointment, patient was advised to schedule another appointment as soon as possible.     The patient was given the opportunity to ask questions and those questions were answered to the patient's satisfaction. The patient was encouraged to call with any additional questions or concerns. Discussed with patient  effects and side effects of medications. Medication safety was discussed.  The patient was informed to contact the office within 7 business days if a message/lab results/referral/imaging results have not been conveyed to the patient.    Electronically signed by Ronal Kerns, FNP-BC   Pulmonary and Critical care    This note may have been partially generated using MModal Fluency Direct system, and there may be some incorrect words, spellings, and punctuation that were not noted in checking the note before saving.

## 2023-10-31 ENCOUNTER — Encounter (HOSPITAL_COMMUNITY): Payer: Self-pay | Admitting: SLEEP MEDICINE

## 2023-10-31 ENCOUNTER — Ambulatory Visit: Payer: Self-pay | Attending: SLEEP MEDICINE | Admitting: SLEEP MEDICINE

## 2023-10-31 ENCOUNTER — Other Ambulatory Visit: Payer: Self-pay

## 2023-10-31 VITALS — BP 150/79 | HR 95 | Temp 97.9°F | Resp 20 | Ht 66.0 in | Wt 170.0 lb

## 2023-10-31 DIAGNOSIS — J449 Chronic obstructive pulmonary disease, unspecified: Secondary | ICD-10-CM | POA: Insufficient documentation

## 2023-10-31 DIAGNOSIS — Z720 Tobacco use: Secondary | ICD-10-CM | POA: Insufficient documentation

## 2023-10-31 DIAGNOSIS — J209 Acute bronchitis, unspecified: Secondary | ICD-10-CM | POA: Insufficient documentation

## 2023-10-31 DIAGNOSIS — E1142 Type 2 diabetes mellitus with diabetic polyneuropathy: Secondary | ICD-10-CM | POA: Insufficient documentation

## 2023-10-31 DIAGNOSIS — G4734 Idiopathic sleep related nonobstructive alveolar hypoventilation: Secondary | ICD-10-CM | POA: Insufficient documentation

## 2023-10-31 DIAGNOSIS — G4733 Obstructive sleep apnea (adult) (pediatric): Secondary | ICD-10-CM | POA: Insufficient documentation

## 2023-10-31 MED ORDER — GUAIFENESIN ER 600 MG TABLET, EXTENDED RELEASE 12 HR
600.0000 mg | EXTENDED_RELEASE_TABLET | Freq: Two times a day (BID) | ORAL | 5 refills | Status: AC
Start: 2023-10-31 — End: ?

## 2023-10-31 MED ORDER — ARFORMOTEROL 15 MCG/2 ML SOLUTION FOR NEBULIZATION
15.0000 ug | INHALATION_SOLUTION | Freq: Two times a day (BID) | RESPIRATORY_TRACT | 5 refills | Status: DC
Start: 2023-10-31 — End: 2023-12-12

## 2023-10-31 MED ORDER — BUDESONIDE 0.5 MG/2 ML SUSPENSION FOR NEBULIZATION
0.5000 mg | INHALATION_SUSPENSION | Freq: Two times a day (BID) | RESPIRATORY_TRACT | 5 refills | Status: DC
Start: 2023-10-31 — End: 2023-12-12

## 2023-10-31 MED ORDER — PREDNISONE 10 MG TABLET
ORAL_TABLET | ORAL | 0 refills | Status: AC
Start: 2023-10-31 — End: 2023-11-10

## 2023-11-10 DIAGNOSIS — J209 Acute bronchitis, unspecified: Secondary | ICD-10-CM | POA: Insufficient documentation

## 2023-11-10 DIAGNOSIS — J449 Chronic obstructive pulmonary disease, unspecified: Secondary | ICD-10-CM | POA: Insufficient documentation

## 2023-11-10 DIAGNOSIS — G4734 Idiopathic sleep related nonobstructive alveolar hypoventilation: Secondary | ICD-10-CM | POA: Insufficient documentation

## 2023-11-10 DIAGNOSIS — Z720 Tobacco use: Secondary | ICD-10-CM | POA: Insufficient documentation

## 2023-11-10 DIAGNOSIS — G4733 Obstructive sleep apnea (adult) (pediatric): Secondary | ICD-10-CM | POA: Insufficient documentation

## 2023-11-10 NOTE — Assessment & Plan Note (Addendum)
 Orders:    CT LUNG CANCER SCREENING PROGRAM BASELINE; Future

## 2023-11-10 NOTE — Assessment & Plan Note (Addendum)
 Orders:    POLYSOMNOGRAPHY-ANP OVERNIGHT-1ST NIGHT OF STUDY (F/U WITH CPAP IF ANP MEETS CLINICAL CRITERIA); Future

## 2023-11-11 ENCOUNTER — Encounter (INDEPENDENT_AMBULATORY_CARE_PROVIDER_SITE_OTHER): Payer: Self-pay

## 2023-11-20 ENCOUNTER — Ambulatory Visit (HOSPITAL_COMMUNITY)

## 2023-11-27 ENCOUNTER — Other Ambulatory Visit: Payer: Self-pay

## 2023-11-27 ENCOUNTER — Ambulatory Visit
Admission: RE | Admit: 2023-11-27 | Discharge: 2023-11-27 | Disposition: A | Discharge status: Home / Self Care | Source: Ambulatory Visit | Attending: SLEEP MEDICINE | Admitting: SLEEP MEDICINE

## 2023-11-27 ENCOUNTER — Encounter (INDEPENDENT_AMBULATORY_CARE_PROVIDER_SITE_OTHER): Payer: Self-pay | Admitting: NEUROLOGY

## 2023-11-27 DIAGNOSIS — F17200 Nicotine dependence, unspecified, uncomplicated: Secondary | ICD-10-CM | POA: Insufficient documentation

## 2023-11-27 DIAGNOSIS — J449 Chronic obstructive pulmonary disease, unspecified: Secondary | ICD-10-CM | POA: Insufficient documentation

## 2023-11-27 DIAGNOSIS — Z122 Encounter for screening for malignant neoplasm of respiratory organs: Secondary | ICD-10-CM | POA: Insufficient documentation

## 2023-11-27 DIAGNOSIS — Z720 Tobacco use: Secondary | ICD-10-CM | POA: Insufficient documentation

## 2023-11-27 DIAGNOSIS — Z87891 Personal history of nicotine dependence: Secondary | ICD-10-CM

## 2023-11-27 DIAGNOSIS — G4736 Sleep related hypoventilation in conditions classified elsewhere: Secondary | ICD-10-CM | POA: Insufficient documentation

## 2023-11-27 DIAGNOSIS — R918 Other nonspecific abnormal finding of lung field: Secondary | ICD-10-CM

## 2023-11-27 DIAGNOSIS — F17210 Nicotine dependence, cigarettes, uncomplicated: Secondary | ICD-10-CM | POA: Insufficient documentation

## 2023-11-30 ENCOUNTER — Ambulatory Visit
Admission: RE | Admit: 2023-11-30 | Discharge: 2023-11-30 | Disposition: A | Discharge status: Home / Self Care | Payer: Self-pay | Source: Ambulatory Visit | Attending: SLEEP MEDICINE | Admitting: SLEEP MEDICINE

## 2023-11-30 ENCOUNTER — Other Ambulatory Visit: Payer: Self-pay

## 2023-11-30 DIAGNOSIS — G4733 Obstructive sleep apnea (adult) (pediatric): Secondary | ICD-10-CM

## 2023-12-02 ENCOUNTER — Other Ambulatory Visit (HOSPITAL_COMMUNITY): Payer: Self-pay | Admitting: SLEEP MEDICINE

## 2023-12-02 DIAGNOSIS — J189 Pneumonia, unspecified organism: Secondary | ICD-10-CM

## 2023-12-02 MED ORDER — PREDNISONE 10 MG TABLET
ORAL_TABLET | ORAL | 0 refills | Status: AC
Start: 2023-12-02 — End: 2023-12-12

## 2023-12-02 MED ORDER — CEFDINIR 300 MG CAPSULE
300.0000 mg | ORAL_CAPSULE | Freq: Two times a day (BID) | ORAL | 0 refills | Status: DC
Start: 2023-12-02 — End: 2024-02-27

## 2023-12-02 NOTE — Progress Notes (Signed)
 Patient called asking about his Chest CT results.  Seen abnormalities and concerned.      Chest CT done 11/27/23 showing results of There are mild groundglass infiltrates in the perihilar regions of the left upper lobe and left lower lobe.  There is a more prominent groundglass infiltrate in the posterior right lower lobe   No pleural effusion.  No pneumothorax.    Attempted to call patient back and number on chart not working number.      Patient called back.  Reports he did have congested cough and increase in shortness of breath, calling it bronchitis for about 6 weeks.  Recently started feeling better.  Advised patient that this looks like inflammation/infection on CT and will treat and repeat in 8 weeks.  ABX and steroid sent in.

## 2023-12-04 NOTE — Procedures (Signed)
 [facility]    Sleep Study    Patient Name: Nathan Hopkins  Date of Birth: 1961-04-02  Gender: male  Provider: Aleene Free, DO  Location: [facility]  Location Address: [address] [place], [address]  [facility]  Location Phone: [phone] [facility]      Primary Care Provider: Earnie VEAR Shams, NP  Referring Provider:Ronal Kerns, NP    Sleep Study    Chief Complaint    Past Medical History  Past Medical History:   Diagnosis Date    Anemia     Anxiety state     Asthma     COPD (chronic obstructive pulmonary disease)     Depression     Diabetes mellitus, type 2     Essential hypertension     Inguinal hernia     Iron deficiency     Psoriasis and similar disorders     ears and back of head           Past Surgical History  Past Surgical History:   Procedure Laterality Date    DENTAL SURGERY      x2    HIP SURGERY      artifical hip    HX APPENDECTOMY             Medication LIst  No outpatient medications have been marked as taking for the 11/30/23 encounter [facility] Encounter) with SLEEP LAB, RM 2 PRN NEURODIAGNOSTICS.       Allergy List  Allergies[1]    Family Medical History  Family Medical History:       Problem Relation (Age of Onset)    Alzheimer's/Dementia Mother    Lung Cancer Father              Social History  Social History     Socioeconomic History    Marital status: Divorced   Tobacco Use    Smoking status: Every Day     Current packs/day: 1.00     Average packs/day: 2.3 packs/day for 39.6 years (91.6 ttl pk-yrs)     Types: Cigarettes     Start date: 67     Passive exposure: Past    Smokeless tobacco: Former     Types: Chew, Snuff    Tobacco comments:     Counseled by M.Lewis FNP on 10/31/23   Vaping Use    Vaping status: Former   Substance and Sexual Activity    Alcohol use: Not Currently    Drug use: Not Currently           Patient Information  Sleep apnea symptoms: Excessive Daytime Sleepiness, Snoring, and Witnessed Apneas  Patient's Height: 66  Patient's Weight:  170  Patient's BMI: 27.4  Neck Circumference: 17.5  Epworth Scale Score: 21    Date of Study: 11/30/2023  Study Performed By: SHAUNNA. Prater, LPN   Study Reviewed By: Rosey Sprinkle, CRT, SDS    Procedure : Procedure: Complete PSG with digital sleep system using the international 10-20 electrode placement for recording EEG, EOG, EMG from chin, ECG, Respiratory effort, oximetry, body position, airflow, snoring sound, pulse rate, and limb movement channels.  Sleep Architecture: Total Recording Time: 459.2 min Total Sleep Time: 276.7 min. Sleep Efficiency Percentage: 58.7%. Patient Sleep Latency (Minutes): 12.4 min. Patient REM Latency (Minutes): 329.5 min. Percentage of Stage I Sleep:  0.7 %. Percentage of Stage II Sleep: 84.3 %. Percentage of Stage III Sleep: 0 %. Percentage (%) of REM sleep: 15.0 %.  Respiratory Data: Number of Obstructive Apneas Observed: 0 . Number of Central Apneas Observed: 0. Number of Mixed Apneas Observed: 0. Total Number of Apneas: 0. Total number of Hypopneas: 12. Snoring During the Study: No  Apnea Hypopnea Index (AHI) per hour: 2.6. Percent of Time Patient Spent in Supine Position: 388.2 %. AHI in Supine Position #: 3.0. Non-supine AHI #: 0.0. REM AHI #: 8.7. Non-REM AHI #:  1.0. Lowest Desaturation Percentage: 70%. Total Amount of Desaturated Time Below or Equal to 25.2 minutes .     Cardiac Data: The Mean Heart Rate During the Study (Beats/Min): 102.2. Sinus Rhythm: PVCs were noted   Periodic Leg Movements: Total Number of Periodic Leg Movements During the Study: 134. 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    Suboptimal sleep efficiency.    OSA present but does not meet criteria for treatment.    Clinically significant hypoxia was present.  Increased PLMS noted.  Follow up in office as scheduled.    Manus Free, DO   12/05/2023  08:15           [1] No Known Allergies

## 2023-12-06 ENCOUNTER — Ambulatory Visit (HOSPITAL_COMMUNITY): Payer: Self-pay | Admitting: SLEEP MEDICINE

## 2023-12-11 NOTE — Progress Notes (Unsigned)
 PULMONARY, [facility]  [address]  [place] [address]  Operated by [facility]     Follow up Sleep Note    Patient Name: Nathan Hopkins  Date: 12/12/2023  Department:  PULMONARY, [facility]  MRN: [id number]  DOB: 1960/11/09  Primary Care Provider:  Earnie VEAR Shams, NP  Referring Provider:  No ref. provider found      Chief Complaint:   Chief Complaint   Patient presents with    Follow Up     Pt presents for a sleep and pulmonary follow up. Chest CT and sleep study completed since last visit.  Current use of nocturnal oxygen through [employer]. He is using Mucinex , Brovana  and Budesonide  neb treatments as ordered and needs refills.          History of Present Illness  Nathan Hopkins is a 63 year old male with chronic bronchitis and low oxygen levels during sleep who presents with persistent respiratory symptoms.    He experiences persistent respiratory symptoms, including significant chest congestion and a noticeable 'rattle' in his chest. His chronic bronchitis has been severe and prolonged this time. He uses Harvoni, budesonide , and albuterol  inhalers, which provide some relief, though he cannot discern a significant difference. He also uses DuoNeb and has been mixing it with albuterol . He recently completed a course of cefdinir .    He has a history of low oxygen levels during sleep, with a recent sleep study showing his oxygen dropping to 70% and staying below 88% for 25 minutes throughout the night. He uses supplemental oxygen at night, initially set at 2 liters, but he has increased it to 3 liters for better comfort. He has experienced episodes of severe oxygen desaturation in the past, including an incident where his oxygen dropped to 69%.    He experiences significant restless leg movements at night, which have been severe enough to knock over objects. He currently takes Neurontin  twice daily, with two doses at bedtime, which has  been part of his treatment for restless leg syndrome. He also takes Requip  (ropinirole ) for this condition.    He has been on Ozempic for blood sugar management, which has caused side effects such as 'sour belches' and bloating. He is on a dose of 1 mg, while his partner is on 3 mg, and she has experienced severe nausea and vomiting with the medication.    His current medications include metformin 2000 mg daily, Neurontin , Requip , and montelukast , which he had stopped but is considering restarting to help with congestion. He also uses a portable nebulizer for his breathing treatments. He has recently quit smoking five days ago, which has already made a difference in his symptoms, particularly the smell on his hands and the frequency of his cough. He has used vapes to aid in quitting smoking.  Started Brovana  and Budesonide , along with Mucinex  at lat appt.       Epworth assessment done in office today with a total score of ***    Frequent shortness of breath with any exertion.     Current smoker.  Has nicotine  patches already ordered and planning on quitting, but is not quite ready yet.       Uses oxygen at 2-3 LPM at nights.     Results  RADIOLOGY  Low dose CT: Infiltrates in the left upper lobe and left lower lobe, ground-glass opacities, inflammation and infection suspected.    DIAGNOSTIC  Sleep study: Oxygen saturation dropped to 70%, stayed  at or below 88% for 25 minutes, no significant apnea.  Chest CT done 11/27/23 showing results of There are mild groundglass infiltrates in the perihilar regions of the left upper lobe and left lower lobe.  There is a more prominent groundglass infiltrate in the posterior right lower lobe   No pleural effusion.  No pneumothorax.    Polysomnography results reviewed from 11/30/23 and results showing desaturation of oxygen with lowest at 70 % And staying at or below 88% for 25.2 Minutes.  Patient shown to have no significant obstructive sleep apnea (OSA) with AHI at 2.6/hour.    High  PLMS with index at 29.1            Past Medical History:  Past Medical History:   Diagnosis Date    Anemia     Anxiety state     Asthma     COPD (chronic obstructive pulmonary disease)     Depression     Diabetes mellitus, type 2     Essential hypertension     Inguinal hernia     Iron deficiency     Psoriasis and similar disorders     ears and back of head    Sciatica      Past Surgical History  Past Surgical History:   Procedure Laterality Date    DENTAL SURGERY      x2    HIP SURGERY      artifical hip    HX APPENDECTOMY       Medication List  Current Outpatient Medications   Medication Sig    ACCU-CHEK GUIDE TEST STRIPS Does not apply Strip     ACCU-CHEK SOFTCLIX LANCETS Misc Use to test blood sugar fingerstick Three times a day fingerstick Three times a day for 90 days    albuterol  sulfate (PROVENTIL  OR VENTOLIN  OR PROAIR ) 90 mcg/actuation Inhalation oral inhaler Take 1 Puff by inhalation    albuterol  sulfate (PROVENTIL  OR VENTOLIN  OR PROAIR ) 90 mcg/actuation Inhalation oral inhaler 1 puff as needed Inhalation every 4 hrs for 90 days    albuterol  sulfate (PROVENTIL ) 2.5 mg /3 mL (0.083 %) Inhalation nebulizer solution Take 3 mL (2.5 mg total) by nebulization Every 4 hours as needed for Wheezing    arformoteroL  (BROVANA ) 15 mcg/2 mL Inhalation Solution for Nebulization Take 2 mL (15 mcg total) by nebulization Twice daily    Benzonatate (TESSALON) 200 mg Oral Capsule 1 Capsule (200 mg total)    budesonide  (PULMICORT  RESPULES) 0.5 mg/2 mL Inhalation nebulizer suspension Take 2 mL (0.5 mg total) by nebulization Twice daily Mix with Brovana  in neb.  Rinse mouth after each use of inhaled steroid.    busPIRone (BUSPAR) 15 mg Oral Tablet     cefdinir  (OMNICEF ) 300 mg Oral Capsule Take 1 Capsule (300 mg total) by mouth Twice daily    celecoxib (CELEBREX) 200 mg Oral Capsule 1 capsule with food Orally 2 times a day for 90 days    CYMBALTA 60 mg Oral Capsule, Delayed Release(E.C.) 2 cap(s) orally once a day    gabapentin   (NEURONTIN ) 300 mg Oral Capsule Take 1 Capsule (300 mg total) by mouth Three times a day    guaiFENesin  (MUCINEX ) 600 mg Oral Tablet Extended Release 12hr Take 1 Tablet (600 mg total) by mouth Every 12 hours    ipratropium-albuterol  0.5 mg-3 mg(2.5 mg base)/3 mL Solution for Nebulization 3 ml by nebulizer 4 times a day for 90 days    Levocetirizine (XYZAL) 5 mg Oral Tablet 1  tablet in the evening Orally Once a day for 90 days    loratadine (CLARITIN) 10 mg Oral Tablet Take 1 Tablet (10 mg total) by mouth    losartan (COZAAR) 25 mg Oral Tablet Take 1 Tablet (25 mg total) by mouth Daily    MetFORMIN (GLUCOPHAGE) 1,000 mg Oral Tablet 1 tablet with a meal Orally 2 times a day for 90 days    montelukast  (SINGULAIR ) 10 mg Oral Tablet 1 Tablet (10 mg total)    nicotine  (NICODERM CQ ) 14 mg/24 hr Transdermal Patch 24 hr Place 1 Patch (14 mg total) on the skin Daily (Patient not taking: Reported on 10/31/2023)    nicotine  (NICODERM CQ ) 7 mg/24 hr Transdermal Patch 24 hr Place 1 Patch (7 mg total) on the skin Daily (Patient not taking: Reported on 10/31/2023)    OZEMPIC 1 mg/dose (4 mg/3 mL) Subcutaneous Pen Injector Inject 1 mg under the skin Every 7 days    polysaccharide iron complex (FERREX 150) 150 mg iron Oral Capsule  (Patient not taking: Reported on 12/12/2023)    prazosin (MINIPRESS) 2 mg Oral Capsule     predniSONE  (DELTASONE ) 10 mg Oral Tablet Take 1 Tablet (10 mg total) by mouth Three times a day for 3 days, THEN 1 Tablet (10 mg total) Twice daily for 3 days, THEN 1 Tablet (10 mg total) Daily for 4 days.    PROTONIX 40 mg Oral Tablet, Delayed Release (E.C.) 1 tab(s) orally once a day for 90 days    rOPINIRole  (REQUIP ) 1 mg Oral Tablet 1 tab(s) orally 2 times a day for 90 days    rosuvastatin (CRESTOR) 5 mg Oral Tablet Take 1 Tablet (5 mg total) by mouth Daily    tamsulosin (FLOMAX) 0.4 mg Oral Capsule 1 cap(s) orally once a day for 90 days     Allergy List  Allergy History as of 12/12/23        No Known Allergies                   Family History   Family Medical History:       Problem Relation (Age of Onset)    Alzheimer's/Dementia Mother    Lung Cancer Father            Social History  Social History     Socioeconomic History    Marital status: Divorced   Tobacco Use    Smoking status: Former     Current packs/day: 0.00     Average packs/day: 2.3 packs/day for 39.6 years (91.6 ttl pk-yrs)     Types: Cigarettes     Start date: 21     Quit date: 12/05/2023     Years since quitting: 0.0     Passive exposure: Past    Smokeless tobacco: Former     Types: Chew, Snuff    Tobacco comments:     Counseled by M.Elliyah Liszewski FNP on 10/31/23   Vaping Use    Vaping status: Former   Substance and Sexual Activity    Alcohol use: Not Currently    Drug use: Not Currently        Review of system  General:  Denies fever, chills, night sweats, loss of appetite.  Neurological:  Denies dizziness or seizures.  Ears, Nose, Throat: Denies ear pain, nasal/sinus congestion or pain, or sore throat.   Gastrointestinal:  Denies uncontrolled reflux, heartburn, nausea, or diarrhea.  Cardiovascular:  Denies chest pain or irregular heartbeats.  Respiratory: see HPI  Musculoskeletal:  Denies uncontrolled joint pain or restless legs.  Endocrine/Metabolic:  Denies weight gain or weight loss.  Mental Status/Psychiatric:  Denies uncontrolled anxiety or depression.  Integumentary:  Denies rash or new abnormal skin lesions.    Vital Signs  Vitals:    12/12/23 1356   BP: (!) 166/76   Pulse: (!) 111   Resp: 20   Temp: 36.4 C (97.5 F)   TempSrc: Oral   SpO2: 94%   Weight: 81.2 kg (179 lb)   Height: 1.676 m (5' 6)   BMI: 28.89          Physical Exam  GENERAL APPEARANCE OF THE PT: Alert, no acute distress. Normal appearance, well nourished.  EYES: Normal eye lids. Conjunctiva normal.  EARS NOSE MOUTH AND THROAT: External inspection of ears and nose with normal appearance. Nares patent.  NECK: Supple with trachea midline, non tender, no nodules, no masses, gland position  midline.  RESPIRATORY: Auscultation of lungs with normal breath sounds, bronchi and expiratory wheeze in left lower lobe, no rales, no rhonchi. Respiratory effort with no retractions, breathing regular and unlabored.  CARDIOVASCULAR: Regular rhythm and regular rate. Heart normal. No murmur, no peripheral edema.  MUSCULOSKELETAL: Normal gait and station, normal digits, no digital cyanosis or clubbing.  MENTAL STATUSPSYCHIATRIC: Alert- oriented to person, place, and time. Appropriate and normal mood.       No diagnosis found.   Assessment & Plan  Ground-glass infiltrates and persistent bronchitis with hypoxemia  Persistent bronchitis with hypoxemia and ground-glass infiltrates in the left upper and lower lobes. Differential diagnosis includes infection, likely pneumonia or severe bronchitis, rather than malignancy. Recent CT scan showed ground-glass opacities, suggestive of inflammation or infection rather than a discrete mass. Hypoxemia during sleep study with oxygen levels dropping to 70, suggesting need for nocturnal oxygen therapy. Recent smoking cessation should aid in recovery.  - Continue Harvoni and budesonide  treatments.  - Continue albuterol  as needed.  - Order high-resolution CT scan on October 6th to monitor infiltrates.  - Continue nocturnal oxygen therapy at 2-3 L/min.  - Increase budesonide  in nebulizer to 1 mg.  - Restart montelukast  to help with congestion.  - Ensure completion of cefdinir  course.  - Avoid smoking and vaping to prevent further lung damage.    Restless legs syndrome  Restless legs syndrome with significant leg movements during sleep, causing disruption. Currently on gabapentin  (Neurontin ) with some benefit. Additional treatment with ropinirole  (Requip ) considered to further manage symptoms.  - Increase ropinirole  to 2 mg at bedtime.    Type 2 diabetes mellitus  Type 2 diabetes mellitus managed with metformin. Blood sugar levels are generally stable, though steroids can cause temporary  increases.  - Continue metformin 2000 mg daily.  - Monitor blood sugar levels, especially when on steroids.    Nicotine  dependence, currently abstinent  Currently abstinent from nicotine , having quit smoking recently. Use of vaping as a cessation aid discussed, with emphasis on reducing and eventually stopping vaping due to potential risks. Educated on the risks of vaping, including potential heavy metal exposure from battery corrosion.  - Encourage continued abstinence from smoking and vaping.  - Provide education on the risks of vaping, including potential heavy metal exposure.    Adverse effects of semaglutide (Ozempic)  Experiencing adverse gastrointestinal effects from semaglutide, including sour belches and bloating. These are common side effects due to the medication's mechanism of slowing gastric emptying.  - Discuss adverse effects with primary care provider for potential medication adjustment.    No  orders of the defined types were placed in this encounter.      Continue medications as prescribed/directed unless changed by provider.  Plan of care discussed with patient.    No follow-ups on file.. Patient was advised to come back earlier if any symptoms get worse.  Also advised to come back for results of any test done.  If unable to keep regular appointment, patient was advised to schedule another appointment as soon as possible.     The patient was given the opportunity to ask questions and those questions were answered to the patient's satisfaction. The patient was encouraged to call with any additional questions or concerns. Discussed with the patient effects and side effects of medications. Medication safety was discussed.  The patient was informed to contact the office within 7 business days if a message/lab results/referral/imaging results have not been conveyed to the patient.    Electronically signed by Ronal Kerns, FNP-BC   Pulmonary and Critical care    This note was created with assistance from  [employer] via capture of conversational audio. Consent was obtained from the patient and all parties present prior to recording.      This note may have been partially generated using MModal Fluency Direct system, and there may be some incorrect words, spellings, and punctuation that were not noted in checking the note before saving.

## 2023-12-12 ENCOUNTER — Encounter (HOSPITAL_COMMUNITY): Payer: Self-pay | Admitting: SLEEP MEDICINE

## 2023-12-12 ENCOUNTER — Ambulatory Visit: Attending: SLEEP MEDICINE | Admitting: SLEEP MEDICINE

## 2023-12-12 ENCOUNTER — Other Ambulatory Visit: Payer: Self-pay

## 2023-12-12 VITALS — BP 166/76 | HR 111 | Temp 97.5°F | Resp 20 | Ht 66.0 in | Wt 179.0 lb

## 2023-12-12 DIAGNOSIS — J9611 Chronic respiratory failure with hypoxia: Secondary | ICD-10-CM | POA: Insufficient documentation

## 2023-12-12 DIAGNOSIS — Z720 Tobacco use: Secondary | ICD-10-CM | POA: Insufficient documentation

## 2023-12-12 DIAGNOSIS — F17200 Nicotine dependence, unspecified, uncomplicated: Secondary | ICD-10-CM

## 2023-12-12 DIAGNOSIS — G2581 Restless legs syndrome: Secondary | ICD-10-CM

## 2023-12-12 DIAGNOSIS — J449 Chronic obstructive pulmonary disease, unspecified: Secondary | ICD-10-CM | POA: Insufficient documentation

## 2023-12-12 DIAGNOSIS — E1142 Type 2 diabetes mellitus with diabetic polyneuropathy: Secondary | ICD-10-CM | POA: Insufficient documentation

## 2023-12-12 DIAGNOSIS — G4734 Idiopathic sleep related nonobstructive alveolar hypoventilation: Secondary | ICD-10-CM | POA: Insufficient documentation

## 2023-12-12 MED ORDER — BUDESONIDE 1 MG/2 ML SUSPENSION FOR NEBULIZATION
1.0000 mg | INHALATION_SUSPENSION | Freq: Two times a day (BID) | RESPIRATORY_TRACT | 5 refills | Status: DC
Start: 2023-12-12 — End: 2024-02-27

## 2023-12-12 MED ORDER — ROPINIROLE 2 MG TABLET
2.0000 mg | ORAL_TABLET | Freq: Three times a day (TID) | ORAL | 5 refills | Status: DC
Start: 2023-12-12 — End: 2024-02-27

## 2023-12-12 MED ORDER — ARFORMOTEROL 15 MCG/2 ML SOLUTION FOR NEBULIZATION
15.0000 ug | INHALATION_SOLUTION | Freq: Two times a day (BID) | RESPIRATORY_TRACT | 5 refills | Status: DC
Start: 2023-12-12 — End: 2024-02-27

## 2023-12-12 MED ORDER — MONTELUKAST 10 MG TABLET
10.0000 mg | ORAL_TABLET | Freq: Every evening | ORAL | 5 refills | Status: DC
Start: 2023-12-12 — End: 2024-02-27

## 2023-12-16 DIAGNOSIS — J9611 Chronic respiratory failure with hypoxia: Secondary | ICD-10-CM | POA: Insufficient documentation

## 2023-12-26 ENCOUNTER — Encounter (INDEPENDENT_AMBULATORY_CARE_PROVIDER_SITE_OTHER): Payer: Self-pay | Admitting: NEUROLOGY

## 2023-12-26 ENCOUNTER — Telehealth (HOSPITAL_COMMUNITY): Payer: Self-pay | Admitting: SLEEP MEDICINE

## 2023-12-26 NOTE — Nursing Note (Signed)
 Patient has a appointment with [facility] on 01/02/24

## 2023-12-26 NOTE — Telephone Encounter (Signed)
 His nurse Lyle Gent with [facility], [phone], called to try to coordinate with you concerning sleep difficulties patient is having. Would like a call back to discuss.

## 2024-01-02 ENCOUNTER — Ambulatory Visit (HOSPITAL_COMMUNITY): Payer: Self-pay | Admitting: SLEEP MEDICINE

## 2024-01-02 ENCOUNTER — Encounter (HOSPITAL_COMMUNITY): Payer: Self-pay | Admitting: SLEEP MEDICINE

## 2024-01-02 NOTE — Telephone Encounter (Signed)
 Spoke with [facility] on phone.  See progress note.

## 2024-01-02 NOTE — Progress Notes (Signed)
 Received call from Home Health Nurse/Psych provider by name of Lyle, She was reporting that this patient is having difficulty going and staying asleep.  Asking to have something for sleep.  PLMS noted on sleep study and may need something for restless leg. No significant sleep apnea noted on study.  Just showing needing oxygen at night.      Will talk to patient at next appt.  Cancelled today's appt and reschedued for mid October.

## 2024-01-27 ENCOUNTER — Other Ambulatory Visit: Payer: Self-pay

## 2024-01-27 ENCOUNTER — Ambulatory Visit
Admission: RE | Admit: 2024-01-27 | Discharge: 2024-01-27 | Disposition: A | Source: Ambulatory Visit | Attending: SLEEP MEDICINE

## 2024-01-27 DIAGNOSIS — J189 Pneumonia, unspecified organism: Secondary | ICD-10-CM | POA: Insufficient documentation

## 2024-01-27 DIAGNOSIS — Z87891 Personal history of nicotine dependence: Secondary | ICD-10-CM

## 2024-01-27 DIAGNOSIS — I2510 Atherosclerotic heart disease of native coronary artery without angina pectoris: Secondary | ICD-10-CM

## 2024-02-04 ENCOUNTER — Ambulatory Visit (HOSPITAL_COMMUNITY): Payer: Self-pay | Admitting: SLEEP MEDICINE

## 2024-02-12 ENCOUNTER — Ambulatory Visit (HOSPITAL_COMMUNITY): Payer: Self-pay | Admitting: SLEEP MEDICINE

## 2024-02-17 ENCOUNTER — Other Ambulatory Visit (HOSPITAL_COMMUNITY): Payer: Self-pay | Admitting: SLEEP MEDICINE

## 2024-02-17 MED ORDER — LEVOFLOXACIN 500 MG TABLET
500.0000 mg | ORAL_TABLET | ORAL | 0 refills | Status: DC
Start: 2024-02-17 — End: 2024-02-27

## 2024-02-17 NOTE — Progress Notes (Signed)
 No recent antibiotics    Sending in abx due to recent CT results

## 2024-02-26 NOTE — Progress Notes (Signed)
 PULMONARY AND SLEEP DISORDERS, [facility]  [address]  [place] [address]  Operated by [facility]     Follow up/Progress Note    Patient Name: Nathan Hopkins  Date: 02/27/2024  Department:  PULMONARY AND SLEEP DISORDERS, [facility]  MRN: [id number]  DOB: 1960-07-27  Primary Care Provider:  Earnie Lorrayne Halsted, NP  Referring Provider:  No ref. provider found      Chief Complaint:   Chief Complaint   Patient presents with    Follow Up     Pt presents for a sleep and pulmonary follow up. Chest CT completed on 10/6. Continued use of oxygen 2-3 LPM at night. Increase in Requip  and Singulair  are helping. Breathing is unchanged.          History of Present Illness  Nathan Hopkins is a 63 year old male with chronic respiratory issues who presents for follow-up of persistent lung abnormalities. He is accompanied by a family member.    He has persistent lung issues characterized by a 'ground glass' appearance on CT scans, which have not improved despite multiple courses of antibiotics, including a recent course of Levaquin. Follow-up imaging showed no improvement. He experiences chronic shortness of breath, which he has had throughout his life, but no other significant symptoms.    He has a history of bronchitis, lasting two months prior to the current lung issues, and has dealt with bronchitis and breathing problems for most of his life, potentially contributing to lung scarring. He uses a nebulizer with Brovana  and Budesonide  and requires a refill for his steroid medication. He also uses a DuoNeb inhaler and has been prescribed Mucinex , although he did not receive it due to insurance issues.    He lived in a house with stagnant water and potential black mold for many years. His family member reports that his husband died of lung cancer.    He has been vaping since his last visit, despite quitting smoking previously. He uses oxygen at night, which is  monitored by his family member.    He experiences restless leg syndrome, which is managed with medication, although he still experiences occasional symptoms when relaxed.    He is currently taking Buspar and Cymbalta for psychiatric conditions and has previously reduced his Buspar dosage from 30 mg three times a day to twice a day.    Epworth assessment done in office today with a total score of 19.     Started Brovana  and Budesonide , along with Mucinex .  Requip  for restless legs.      Frequent shortness of breath with any exertion.        Recent quit smoking. Reports quit 12/05/23.     Uses oxygen at 2-3 LPM at nights.     Results     Chest CT done 11/27/23 showing results of There are mild groundglass infiltrates in the perihilar regions of the left upper lobe and left lower lobe.  There is a more prominent groundglass infiltrate in the posterior right lower lobe   No pleural effusion.  No pneumothorax.    Antibiotic prescribed after seen results of Abx.     Chest CT 01/27/24 showing Multiple pulmonary nodules are identified. They haven't upper and lower lung distribution with some being solid and others being groundglass in appearance. Overall these findings are greater than the previous exam.  No pleural effusion.  No pneumothorax. Also showing coronary calcifications.      Another antibiotic prescribed after results.  Polysomnography results reviewed from 11/30/23 and results showing desaturation of oxygen with lowest at 70 % And staying at or below 88% for 25.2 Minutes.  Patient shown to have no significant obstructive sleep apnea (OSA) with AHI at 2.6/hour.  High PLMS with index at 29.1      Past Medical History:  Past Medical History:   Diagnosis Date    Anemia     Anxiety state     Asthma     COPD (chronic obstructive pulmonary disease)     Depression     Diabetes mellitus, type 2     Essential hypertension     Inguinal hernia     Iron deficiency     Psoriasis and similar disorders     ears and back of head     Sciatica      Past Surgical History  Past Surgical History:   Procedure Laterality Date    DENTAL SURGERY      x2    HIP SURGERY      artifical hip    HX APPENDECTOMY       Medication List  Current Outpatient Medications   Medication Sig    ACCU-CHEK GUIDE TEST STRIPS Does not apply Strip     ACCU-CHEK SOFTCLIX LANCETS Misc Use to test blood sugar fingerstick Three times a day fingerstick Three times a day for 90 days    albuterol  sulfate (PROVENTIL  OR VENTOLIN  OR PROAIR ) 90 mcg/actuation Inhalation oral inhaler 1 puff as needed Inhalation every 4 hrs for 90 days    albuterol  sulfate (PROVENTIL  OR VENTOLIN  OR PROAIR ) 90 mcg/actuation Inhalation oral inhaler Take 1 Puff by inhalation Every 4 hours as needed    albuterol  sulfate (PROVENTIL ) 2.5 mg /3 mL (0.083 %) Inhalation nebulizer solution Take 3 mL (2.5 mg total) by nebulization Every 4 hours as needed for Wheezing    arformoteroL  (BROVANA ) 15 mcg/2 mL Inhalation Solution for Nebulization Take 2 mL (15 mcg total) by nebulization Twice daily    Benzonatate (TESSALON) 200 mg Oral Capsule 1 Capsule (200 mg total) (Patient not taking: Reported on 02/27/2024)    budesonide  (PULMICORT  RESPULES) 1 mg/2 mL Inhalation Suspension for Nebulization nebulizer suspension Take 2 mL (1 mg total) by nebulization Twice daily Mix with Aformoterol. Rinse mouth after each use    busPIRone (BUSPAR) 15 mg Oral Tablet     celecoxib (CELEBREX) 200 mg Oral Capsule 1 capsule with food Orally 2 times a day for 90 days    CYMBALTA 60 mg Oral Capsule, Delayed Release(E.C.) 2 cap(s) orally once a day    gabapentin  (NEURONTIN ) 300 mg Oral Capsule Take 1 Capsule (300 mg total) by mouth Three times a day    guaiFENesin  (MUCINEX ) 600 mg Oral Tablet Extended Release 12hr Take 1 Tablet (600 mg total) by mouth Every 12 hours (Patient not taking: Reported on 02/27/2024)    ipratropium-albuterol  0.5 mg-3 mg(2.5 mg base)/3 mL Solution for Nebulization 3 ml by nebulizer 4 times a day for 90 days     Levocetirizine (XYZAL) 5 mg Oral Tablet 1 tablet in the evening Orally Once a day for 90 days    levoFLOXacin (LEVAQUIN) 500 mg Oral Tablet Take 1 Tablet (500 mg total) by mouth Every 24 hours for 7 days    loratadine (CLARITIN) 10 mg Oral Tablet Take 1 Tablet (10 mg total) by mouth    losartan (COZAAR) 25 mg Oral Tablet Take 1 Tablet (25 mg total) by mouth Daily    MetFORMIN (GLUCOPHAGE)  1,000 mg Oral Tablet 1 tablet with a meal Orally 2 times a day for 90 days    montelukast  (SINGULAIR ) 10 mg Oral Tablet Take 1 Tablet (10 mg total) by mouth Every evening    nicotine  (NICODERM CQ ) 14 mg/24 hr Transdermal Patch 24 hr Place 1 Patch (14 mg total) on the skin Daily (Patient not taking: Reported on 10/31/2023)    nicotine  (NICODERM CQ ) 7 mg/24 hr Transdermal Patch 24 hr Place 1 Patch (7 mg total) on the skin Daily (Patient not taking: Reported on 10/31/2023)    OZEMPIC 1 mg/dose (4 mg/3 mL) Subcutaneous Pen Injector Inject 1 mg under the skin Every 7 days    polysaccharide iron complex (FERREX 150) 150 mg iron Oral Capsule  (Patient not taking: Reported on 12/12/2023)    prazosin (MINIPRESS) 2 mg Oral Capsule     PROTONIX 40 mg Oral Tablet, Delayed Release (E.C.) 1 tab(s) orally once a day for 90 days    rOPINIRole  (REQUIP ) 2 mg Oral Tablet Take 1 Tablet (2 mg total) by mouth Three times a day    rosuvastatin (CRESTOR) 5 mg Oral Tablet Take 1 Tablet (5 mg total) by mouth Daily    tamsulosin (FLOMAX) 0.4 mg Oral Capsule 1 cap(s) orally once a day for 90 days     Allergy List  Allergy History as of 02/27/24        No Known Allergies                  Family History   Family Medical History:       Problem Relation (Age of Onset)    Alzheimer's/Dementia Mother    Lung Cancer Father            Social History  Social History     Socioeconomic History    Marital status: Divorced   Tobacco Use    Smoking status: Former     Current packs/day: 0.00     Average packs/day: 2.3 packs/day for 39.6 years (91.6 ttl pk-yrs)     Types:  Cigarettes     Start date: 58     Quit date: 12/05/2023     Years since quitting: 0.2     Passive exposure: Past    Smokeless tobacco: Former     Types: Chew, Snuff    Tobacco comments:     Counseled by M.Firmin Belisle FNP on 10/31/23   Vaping Use    Vaping status: Every Day   Substance and Sexual Activity    Alcohol use: Not Currently    Drug use: Not Currently        Review of system  General:  Denies fever, chills, night sweats, loss of appetite.  Neurological:  Denies dizziness or seizures.  Ears, Nose, Throat: Denies ear pain, nasal/sinus congestion or pain, or sore throat.   Gastrointestinal:  Denies uncontrolled reflux, heartburn, nausea, or diarrhea.  Cardiovascular:  Denies chest pain or irregular heartbeats.  Respiratory: see HPI  Musculoskeletal:  Denies uncontrolled joint pain or restless legs.  Endocrine/Metabolic:  Denies weight gain or weight loss.  Mental Status/Psychiatric:  Denies uncontrolled anxiety or depression.  Integumentary:  Denies rash or new abnormal skin lesions.    Objective:  Vital Signs  Vitals:    02/27/24 0820   BP: (!) 157/78   Pulse: 96   Resp: 16   Temp: 36.6 C (97.9 F)   TempSrc: Oral   SpO2: 93%   Weight: 83.9 kg (185 lb)   Height:  1.676 m (5' 6)   BMI: 29.86          Physical Exam  CONSTITUTIONAL: Vital signs stable.  GENERAL APPEARANCE OF THE PT: Alert, no acute distress.  Normal appearance, well nourished.  EYES: Normal eye lids.  Conjunctiva normal.  EARS NOSE MOUTH AND THROAT: External inspection of ears and nose with normal appearance.  Nares patent.  NECK: Supple with trachea midline, non tender, no nodules, no masses, gland position midline.  RESPIRATORY: Auscultation of lungs with distant breath sounds, scattered expiratory wheezing.  Respiratory effort with no tractions, breathing regular and unlabored.  CARDIOVASCULAR: Regular rhythm and regular rate.  No murmur, no peripheral edema.  MUSCULOSKELETAL: Normal gait and station, normal digits, no digital cyanosis or  clubbing.  MENTAL STATUSPSYCHIATRIC: Alert- oriented to person, place, and time.  Appropriate and normal mood         ICD-10-CM    1. Abnormal CT scan of lung  R91.8 CT Chest WO      2. Pneumonia  J18.9 CT Chest WO         Assessment & Plan  Abnormal lung imaging (ground glass opacity) with suspected chronic lung infection  Persistent ground glass opacity on CT scan, unchanged since August, suggesting chronic lung infection or inflammation. Differential includes infection requiring specific antibiotics or rare malignancy, though unlikely. Previous antibiotics, including Levaquin, have not improved the condition. Bronchoscopy with biopsy and cultures is recommended to determine the cause and guide treatment.  - Discussed option to refer to pulmonologist for bronchoscopy with biopsy and cultures.  Patient prefers to do another CT scan since completed antibiotic before bronch.  - Prescribed antibiotic to be taken one week before bronchoscopy unless symptoms worsen.  - Ordered repeat CT scan in two months to assess for changes in ground glass opacity.  - Encouraged cessation of vaping to reduce lung irritation.    Chronic bronchitis  Long-standing history of chronic bronchitis with recurrent episodes. Current symptoms include shortness of breath and cough, possibly exacerbated by environmental factors such as past exposure to mold and stagnant water.  - Continue current inhaler regimen with Brovana  and nebulizer treatments.  - Refilled DuoNeb and Mucinex  prescriptions.    Nicotine  dependence (vaping)  Continued vaping despite previous cessation efforts. Acknowledges the habit and expresses willingness to quit by the end of the year. Discussed strategies to aid cessation, including behavioral modifications and alternative activities.  - Encouraged cessation of vaping by the end of the year.  - Provided support and strategies for quitting, including behavioral modifications and alternative activities.    Restless legs  syndrome  Experiences occasional leg movements when relaxed, consistent with restless legs syndrome.    Major depressive disorder  Currently on Cymbalta and Buspar for management. Expresses desire to taper off medications. Advised on the importance of gradual tapering to avoid withdrawal symptoms.  - Advised gradual tapering of Buspar and Cymbalta to avoid withdrawal symptoms.  - Continue monitoring mental health status during tapering process.    Orders Placed This Encounter    CT Chest WO    levoFLOXacin (LEVAQUIN) 500 mg Oral Tablet    rOPINIRole  (REQUIP ) 2 mg Oral Tablet    montelukast  (SINGULAIR ) 10 mg Oral Tablet    budesonide  (PULMICORT  RESPULES) 1 mg/2 mL Inhalation Suspension for Nebulization nebulizer suspension    arformoteroL  (BROVANA ) 15 mcg/2 mL Inhalation Solution for Nebulization    albuterol  sulfate (PROVENTIL  OR VENTOLIN  OR PROAIR ) 90 mcg/actuation Inhalation oral inhaler  Continue medications as prescribed/directed unless changed by provider.  Plan of care discussed with patient.    Return for week before Christmas after CT scan.. Patient was advised to come back earlier if any symptoms get worse.  Also advised to come back for results of any test done.  If unable to keep regular appointment, patient was advised to schedule another appointment as soon as possible.     The patient was given the opportunity to ask questions and those questions were answered to the patient's satisfaction. The patient was encouraged to call with any additional questions or concerns. Discussed with the patient effects and side effects of medications. Medication safety was discussed.  The patient was informed to contact the office within 7 business days if a message/lab results/referral/imaging results have not been conveyed to the patient.    Electronically signed by Ronal Kerns, APRN, CNP     This note was created with assistance from [employer] via capture of conversational audio. Consent was obtained from the  patient and all parties present prior to recording.        This note may have been partially generated using [employer] Fluency Direct system, and there may be some incorrect words, spellings, and punctuation that were not noted in checking the note before saving.

## 2024-02-27 ENCOUNTER — Ambulatory Visit: Payer: Self-pay | Attending: SLEEP MEDICINE | Admitting: SLEEP MEDICINE

## 2024-02-27 ENCOUNTER — Encounter (HOSPITAL_COMMUNITY): Payer: Self-pay | Admitting: SLEEP MEDICINE

## 2024-02-27 ENCOUNTER — Encounter (HOSPITAL_COMMUNITY): Payer: Self-pay

## 2024-02-27 ENCOUNTER — Other Ambulatory Visit: Payer: Self-pay

## 2024-02-27 VITALS — BP 157/78 | HR 96 | Temp 97.9°F | Resp 16 | Ht 66.0 in | Wt 185.0 lb

## 2024-02-27 DIAGNOSIS — R918 Other nonspecific abnormal finding of lung field: Secondary | ICD-10-CM | POA: Insufficient documentation

## 2024-02-27 DIAGNOSIS — G2581 Restless legs syndrome: Secondary | ICD-10-CM

## 2024-02-27 DIAGNOSIS — F32A Depression, unspecified: Secondary | ICD-10-CM

## 2024-02-27 DIAGNOSIS — J42 Unspecified chronic bronchitis: Secondary | ICD-10-CM

## 2024-02-27 DIAGNOSIS — F17290 Nicotine dependence, other tobacco product, uncomplicated: Secondary | ICD-10-CM

## 2024-02-27 DIAGNOSIS — J189 Pneumonia, unspecified organism: Secondary | ICD-10-CM | POA: Insufficient documentation

## 2024-02-27 MED ORDER — ROPINIROLE 2 MG TABLET: 2.0000 mg | ORAL_TABLET | Freq: Three times a day (TID) | ORAL | 5 refills | Status: DC

## 2024-02-27 MED ORDER — MONTELUKAST 10 MG TABLET: 10.0000 mg | ORAL_TABLET | Freq: Every evening | ORAL | 5 refills | Status: DC

## 2024-02-27 MED ORDER — LEVOFLOXACIN 500 MG TABLET
500.0000 mg | ORAL_TABLET | ORAL | 0 refills | Status: AC
Start: 2024-02-27 — End: 2024-03-05

## 2024-02-27 MED ORDER — ARFORMOTEROL 15 MCG/2 ML SOLUTION FOR NEBULIZATION: 15.0000 ug | INHALATION_SOLUTION | Freq: Two times a day (BID) | RESPIRATORY_TRACT | 5 refills | Status: DC

## 2024-02-27 MED ORDER — ALBUTEROL SULFATE HFA 90 MCG/ACTUATION AEROSOL INHALER
1.0000 | INHALATION_SPRAY | RESPIRATORY_TRACT | 5 refills | Status: AC | PRN
Start: 2024-02-27 — End: ?

## 2024-02-27 MED ORDER — BUDESONIDE 1 MG/2 ML SUSPENSION FOR NEBULIZATION: 1.0000 mg | INHALATION_SUSPENSION | Freq: Two times a day (BID) | RESPIRATORY_TRACT | 5 refills | Status: DC

## 2024-03-05 ENCOUNTER — Encounter (INDEPENDENT_AMBULATORY_CARE_PROVIDER_SITE_OTHER): Payer: Self-pay | Admitting: NEUROLOGY

## 2024-03-13 ENCOUNTER — Encounter (INDEPENDENT_AMBULATORY_CARE_PROVIDER_SITE_OTHER): Admitting: NEUROLOGY

## 2024-04-07 ENCOUNTER — Ambulatory Visit: Admission: RE | Admit: 2024-04-07 | Discharge: 2024-04-07 | Attending: SLEEP MEDICINE

## 2024-04-07 ENCOUNTER — Other Ambulatory Visit: Payer: Self-pay

## 2024-04-07 ENCOUNTER — Ambulatory Visit (HOSPITAL_COMMUNITY): Payer: Self-pay | Admitting: SLEEP MEDICINE

## 2024-04-07 DIAGNOSIS — R918 Other nonspecific abnormal finding of lung field: Secondary | ICD-10-CM | POA: Insufficient documentation

## 2024-04-07 DIAGNOSIS — J189 Pneumonia, unspecified organism: Secondary | ICD-10-CM | POA: Insufficient documentation

## 2024-04-09 ENCOUNTER — Other Ambulatory Visit: Payer: Self-pay

## 2024-04-09 ENCOUNTER — Encounter (HOSPITAL_COMMUNITY): Payer: Self-pay | Admitting: SLEEP MEDICINE

## 2024-04-09 ENCOUNTER — Ambulatory Visit: Payer: Self-pay | Attending: SLEEP MEDICINE | Admitting: SLEEP MEDICINE

## 2024-04-09 VITALS — BP 147/76 | HR 90 | Temp 97.6°F | Resp 20 | Ht 66.0 in | Wt 190.0 lb

## 2024-04-09 DIAGNOSIS — R002 Palpitations: Secondary | ICD-10-CM | POA: Insufficient documentation

## 2024-04-09 DIAGNOSIS — G4734 Idiopathic sleep related nonobstructive alveolar hypoventilation: Secondary | ICD-10-CM | POA: Insufficient documentation

## 2024-04-09 DIAGNOSIS — F17290 Nicotine dependence, other tobacco product, uncomplicated: Secondary | ICD-10-CM

## 2024-04-09 DIAGNOSIS — J9611 Chronic respiratory failure with hypoxia: Secondary | ICD-10-CM | POA: Insufficient documentation

## 2024-04-09 DIAGNOSIS — Z720 Tobacco use: Secondary | ICD-10-CM | POA: Insufficient documentation

## 2024-04-09 DIAGNOSIS — J449 Chronic obstructive pulmonary disease, unspecified: Secondary | ICD-10-CM | POA: Insufficient documentation

## 2024-04-09 DIAGNOSIS — F17200 Nicotine dependence, unspecified, uncomplicated: Secondary | ICD-10-CM

## 2024-04-09 DIAGNOSIS — R918 Other nonspecific abnormal finding of lung field: Secondary | ICD-10-CM | POA: Insufficient documentation

## 2024-04-09 MED ORDER — MONTELUKAST 10 MG TABLET: 10.0000 mg | ORAL_TABLET | Freq: Every evening | ORAL | 5 refills | Status: AC

## 2024-04-09 MED ORDER — ROPINIROLE 2 MG TABLET: 2.0000 mg | ORAL_TABLET | Freq: Three times a day (TID) | ORAL | 5 refills | Status: DC

## 2024-04-09 MED ORDER — ARFORMOTEROL 15 MCG/2 ML SOLUTION FOR NEBULIZATION: 15.0000 ug | INHALATION_SOLUTION | Freq: Two times a day (BID) | RESPIRATORY_TRACT | 5 refills | Status: AC

## 2024-04-09 MED ORDER — BUDESONIDE 0.5 MG/2 ML SUSPENSION FOR NEBULIZATION: 0.5000 mg | INHALATION_SUSPENSION | Freq: Two times a day (BID) | RESPIRATORY_TRACT | 5 refills | Status: AC

## 2024-04-09 MED ORDER — LEVOFLOXACIN 500 MG TABLET: 500.0000 mg | ORAL_TABLET | ORAL | 0 refills | Status: AC

## 2024-04-09 NOTE — Progress Notes (Signed)
 PULMONARY AND SLEEP DISORDERS, [facility]  [address]  [place] [address]  Operated by [facility]     Follow up/Progress Note    Patient Name: Nathan Hopkins  Date: 04/09/2024  Department:  PULMONARY AND SLEEP DISORDERS, [facility]  MRN: [id number]  DOB: [birth year]  Primary Care Provider:  Earnie Lorrayne Halsted, NP  Referring Provider:  No ref. provider found      Chief Complaint:   Chief Complaint   Patient presents with    Follow Up     Pt presents for a sleep and pulmonary follow up. Continued use of oxygen at night. Chest CT completed on 12/16. Patient needs refills on Brovana  and Pulmicort .         History of Present Illness  Nathan Hopkins is a 63 year old male with asthma who presents for a follow-up on his respiratory condition and medication management.    He has been out of his steroid medication for some time and did not receive a refill after his last visit, having only received one prescription initially. He currently uses DuoNeb for his nebulizer, which he uses variably depending on his needs, and inquires about its expiration. He does not have a long-acting bronchodilator and only uses an albuterol -like inhaler. He mentions using Breztri and expresses a need for Brovana  and Pulmicort , which he does not currently have. Most of his medications are obtained through [facility] and [facility] for immediate needs.    He recently had a CT scan two days ago. He has a persistent cough that is hard to clear but has not been coughing much in the past week. No congestion this year, which is unusual for him. He uses oxygen at night but has skipped it a few times due to fatigue. He does not use Mucinex  as he is not congested.    He experiences shortness of breath after using the bathroom, a long-standing issue, and describes needing to use his nebulizer after urination or bowel movements. He has a history of asthma and uses a nebulizer to  manage his symptoms.    He describes experiencing pain in his left foot, especially at night, which he attributes to wearing compression socks. He has a history of a broken left foot and diabetic neuropathy. Removing the socks at night has alleviated the pain.    He takes Requip  for restless leg syndrome and requests a refill. He also takes Singulair  and Flomax for prostate issues, noting that he sometimes has to bear down to urinate.    He quit all his psych medications and reports doing well without them. He mentions a previous heart monitor test that did not yield results and has not followed up with a cardiologist since then. He occasionally experiences 'butterflies' but not specifically during bathroom use.    He has recently moved to a new residence where he can perform daily activities more easily. He has quit smoking and is attempting to quit vaping, having been successful for two weeks.     He experiences restless leg syndrome, which is managed with medication, although he still experiences occasional symptoms when relaxed. Started Brovana  and Budesonide , along with Mucinex .  Requip  for restless legs.      Frequent shortness of breath with any exertion.        Recent quit smoking. Reports quit 12/05/23.     Uses oxygen at 2-3 LPM at nights.        Results  Radiology  Chest CT (04/07/2024): Stable pulmonary nodules and fibrosis, some nodular areas slightly decreased in size, persistent fibrosis, new area of parenchymal abnormality in right lower lobe, overall stable findings     Chest CT done 11/27/23 showing results of There are mild groundglass infiltrates in the perihilar regions of the left upper lobe and left lower lobe.  There is a more prominent groundglass infiltrate in the posterior right lower lobe.  No pleural effusion.  No pneumothorax.     Antibiotic prescribed after seen results of Abx.      Chest CT 01/27/24 showing Multiple pulmonary nodules are identified. They haven't upper and lower lung  distribution with some being solid and others being groundglass in appearance. Overall these findings are greater than the previous exam.  No pleural effusion.  No pneumothorax. Also showing coronary calcifications.       Another antibiotic prescribed after results.       Polysomnography results reviewed from 11/30/23 and results showing desaturation of oxygen with lowest at 70 % And staying at or below 88% for 25.2 Minutes.  Patient shown to have no significant obstructive sleep apnea (OSA) with AHI at 2.6/hour.  High PLMS with index at 29.1        Past Medical History:  Past Medical History:   Diagnosis Date    Anemia     Anxiety state     Asthma     COPD (chronic obstructive pulmonary disease)     Depression     Diabetes mellitus, type 2     Essential hypertension     Inguinal hernia     Iron deficiency     Psoriasis and similar disorders     ears and back of head    Sciatica      Past Surgical History  Past Surgical History:   Procedure Laterality Date    DENTAL SURGERY      x2    HIP SURGERY      artifical hip    HX APPENDECTOMY       Medication List  Current Outpatient Medications   Medication Sig    ACCU-CHEK GUIDE TEST STRIPS Does not apply Strip     ACCU-CHEK SOFTCLIX LANCETS Misc Use to test blood sugar fingerstick Three times a day fingerstick Three times a day for 90 days    albuterol  sulfate (PROVENTIL  OR VENTOLIN  OR PROAIR ) 90 mcg/actuation Inhalation oral inhaler 1 puff as needed Inhalation every 4 hrs for 90 days    albuterol  sulfate (PROVENTIL  OR VENTOLIN  OR PROAIR ) 90 mcg/actuation Inhalation oral inhaler Take 1 Puff by inhalation Every 4 hours as needed    albuterol  sulfate (PROVENTIL ) 2.5 mg /3 mL (0.083 %) Inhalation nebulizer solution Take 3 mL (2.5 mg total) by nebulization Every 4 hours as needed for Wheezing    arformoteroL  (BROVANA ) 15 mcg/2 mL Inhalation Solution for Nebulization Take 2 mL (15 mcg total) by nebulization Twice daily    Benzonatate (TESSALON) 200 mg Oral Capsule 1 Capsule (200  mg total)    budesonide  (PULMICORT  RESPULES) 0.5 mg/2 mL Inhalation nebulizer suspension Take 2 mL (0.5 mg total) by nebulization Twice daily Mix in nebulizer with Brovana .  Rinse mouth well after each use    busPIRone (BUSPAR) 15 mg Oral Tablet  (Patient not taking: Reported on 04/09/2024)    celecoxib (CELEBREX) 200 mg Oral Capsule 1 capsule with food Orally 2 times a day for 90 days    CYMBALTA 60 mg Oral Capsule, Delayed Release(E.C.) 2 cap(s) orally  once a day (Patient not taking: Reported on 04/09/2024)    gabapentin  (NEURONTIN ) 300 mg Oral Capsule Take 1 Capsule (300 mg total) by mouth Three times a day    guaiFENesin  (MUCINEX ) 600 mg Oral Tablet Extended Release 12hr Take 1 Tablet (600 mg total) by mouth Every 12 hours (Patient not taking: Reported on 04/09/2024)    ipratropium-albuterol  0.5 mg-3 mg(2.5 mg base)/3 mL Solution for Nebulization 3 ml by nebulizer 4 times a day for 90 days    Levocetirizine (XYZAL) 5 mg Oral Tablet 1 tablet in the evening Orally Once a day for 90 days    levoFLOXacin  (LEVAQUIN ) 500 mg Oral Tablet Take 1 Tablet (500 mg total) by mouth Every 24 hours    loratadine (CLARITIN) 10 mg Oral Tablet Take 1 Tablet (10 mg total) by mouth    losartan (COZAAR) 25 mg Oral Tablet Take 1 Tablet (25 mg total) by mouth Daily (Patient not taking: Reported on 04/09/2024)    MetFORMIN (GLUCOPHAGE) 1,000 mg Oral Tablet 1 tablet with a meal Orally 2 times a day for 90 days    montelukast  (SINGULAIR ) 10 mg Oral Tablet Take 1 Tablet (10 mg total) by mouth Every evening    nicotine  (NICODERM CQ ) 14 mg/24 hr Transdermal Patch 24 hr Place 1 Patch (14 mg total) on the skin Daily (Patient not taking: Reported on 04/09/2024)    nicotine  (NICODERM CQ ) 7 mg/24 hr Transdermal Patch 24 hr Place 1 Patch (7 mg total) on the skin Daily (Patient not taking: Reported on 04/09/2024)    OZEMPIC 1 mg/dose (4 mg/3 mL) Subcutaneous Pen Injector Inject 1 mg under the skin Every 7 days    polysaccharide iron complex (FERREX  150) 150 mg iron Oral Capsule     prazosin (MINIPRESS) 2 mg Oral Capsule  (Patient not taking: Reported on 04/09/2024)    PROTONIX 40 mg Oral Tablet, Delayed Release (E.C.) 1 tab(s) orally once a day for 90 days    rOPINIRole  (REQUIP ) 2 mg Oral Tablet Take 1 Tablet (2 mg total) by mouth Three times a day    rosuvastatin (CRESTOR) 5 mg Oral Tablet Take 1 Tablet (5 mg total) by mouth Daily    tamsulosin (FLOMAX) 0.4 mg Oral Capsule 1 cap(s) orally once a day for 90 days     Allergy List  Allergy History as of 04/22/24        No Known Allergies                  Family History   Family Medical History:       Problem Relation (Age of Onset)    Alzheimer's/Dementia Mother    Lung Cancer Father            Social History  Social History     Socioeconomic History    Marital status: Divorced   Tobacco Use    Smoking status: Former     Current packs/day: 0.00     Average packs/day: 2.3 packs/day for 39.6 years (91.6 ttl pk-yrs)     Types: Cigarettes     Start date: 66     Quit date: 12/05/2023     Years since quitting: 0.3     Passive exposure: Past    Smokeless tobacco: Former     Types: Chew, Snuff    Tobacco comments:     Counseled by M.Makell Drohan FNP on 10/31/23   Vaping Use    Vaping status: Every Day   Substance and Sexual Activity  Alcohol use: Not Currently    Drug use: Not Currently        Review of system  General:  Denies fever, chills, night sweats, loss of appetite.  Neurological:  Denies dizziness or seizures.  Ears, Nose, Throat: Denies ear pain, nasal/sinus congestion or pain, or sore throat.   Gastrointestinal:  Denies uncontrolled reflux, heartburn, nausea, or diarrhea.  Cardiovascular:  Denies chest pain or irregular heartbeats.  Respiratory: see HPI  Musculoskeletal:  Denies uncontrolled joint pain or restless legs.  Endocrine/Metabolic:  Denies weight gain or weight loss.  Mental Status/Psychiatric:  Denies uncontrolled anxiety or depression.  Integumentary:  Denies rash or new abnormal skin  lesions.    Objective:  Vital Signs  Vitals:    04/09/24 0934   BP: (!) 147/76   Pulse: 90   Resp: 20   Temp: 36.4 C (97.6 F)   TempSrc: Tympanic   SpO2: 97%   Weight: 86.2 kg (190 lb)   Height: 1.676 m (5' 6)   BMI: 30.67          Physical Exam  GENERAL APPEARANCE OF THE PT: Alert, no acute distress. Normal appearance, well nourished.  EYES: Normal eye lids. Conjunctiva normal.  EARS NOSE MOUTH AND THROAT: External inspection of ears and nose with normal appearance. Nares patent.  NECK: Supple with trachea midline, non tender, no nodules, no masses, gland position midline.  RESPIRATORY: Auscultation of lungs with normal breath sounds, crackles in the right upper lobe. Respiratory effort with no tractions, breathing regular and unlabored.  CARDIOVASCULAR: Regular rhythm and regular rate. No murmur, no peripheral edema.  MUSCULOSKELETAL: Normal gait and station, normal digits, no digital cyanosis or clubbing.  MENTAL STATUSPSYCHIATRIC: Alert- oriented to person, place, and time. Appropriate and normal mood.         ICD-10-CM    1. Chronic obstructive pulmonary disease, unspecified COPD type (CMS HCC)  J44.9       2. Abnormal CT scan of lung  R91.8 CT Chest WO      3. Chronic respiratory failure with hypoxia (CMS HCC)  J96.11       4. 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 Palpitations  R00.2 3 DAY EXTENDED HOLTER MONITOR      6. Tobacco use  Z72.0          Assessment & Plan  Chronic obstructive pulmonary disease with chronic respiratory failure and hypoxia  COPD with chronic respiratory failure and hypoxia. Reports difficulty obtaining Brovana  and Pulmicort  for nebulizer. Uses DuoNeb effectively. No recent increase in coughing or congestion. Uses oxygen at night, occasionally skips due to fatigue. No current need for Mucinex  as no congestion reported.  - Sent prescriptions for Brovana  and Pulmicort  to [facility] for nebulizer use.  - Continue DuoNeb as needed.  - Use oxygen at night as prescribed.  - Consider  over-the-counter Mucinex  if congestion develops.    Abnormal lung CT with stable nodules and scarring  Recent CT scan shows stable nodules with some reduction in size, indicating less concern. New area of irritation in the right lower lobe, likely due to scarring from previous infections. No significant increase in coughing or congestion.  - Ordered Levaquin  for 7 days to address potential infection.  - Scheduled follow-up CT scan in 6 months.    Nocturnal hypoxemia  Managed with nighttime oxygen use. Occasionally skips oxygen due to fatigue.  - Continue nighttime oxygen use as prescribed.    Palpitations  Reports palpitations,  especially after using the bathroom, possibly related to bearing down. Previous heart monitor test was inconclusive due to lead detachment. No recent cardiology follow-up.  - Ordered Holter monitor to assess heart activity during events.  - Instructed to press the event button on the Holter monitor during palpitations.    Tobacco and nicotine  dependence (vaping)  Continues to vape despite awareness of health risks. Has been vaping for two weeks without incident.  - Encouraged cessation of vaping.    Orders Placed This Encounter    CT Chest WO    3 DAY EXTENDED HOLTER MONITOR    arformoteroL  (BROVANA ) 15 mcg/2 mL Inhalation Solution for Nebulization    budesonide  (PULMICORT  RESPULES) 0.5 mg/2 mL Inhalation nebulizer suspension    montelukast  (SINGULAIR ) 10 mg Oral Tablet    rOPINIRole  (REQUIP ) 2 mg Oral Tablet    levoFLOXacin  (LEVAQUIN ) 500 mg Oral Tablet        Continue medications as prescribed/directed unless changed by provider.  Plan of care discussed with patient.    Return in about 6 months (around 10/08/2024).. Patient was advised to come back earlier if any symptoms get worse.  Also advised to come back for results of any test done.  If unable to keep regular appointment, patient was advised to schedule another appointment as soon as possible.     The patient was given the opportunity  to ask questions and those questions were answered to the patient's satisfaction. The patient was encouraged to call with any additional questions or concerns. Discussed with the patient effects and side effects of medications. Medication safety was discussed.  The patient was informed to contact the office within 7 business days if a message/lab results/referral/imaging results have not been conveyed to the patient.    Electronically signed by Ronal Kerns, APRN, CNP     This note was created with assistance from [employer] via capture of conversational audio. Consent was obtained from the patient and all parties present prior to recording.        This note may have been partially generated using [employer] Fluency Direct system, and there may be some incorrect words, spellings, and punctuation that were not noted in checking the note before saving.

## 2024-04-10 ENCOUNTER — Encounter (HOSPITAL_COMMUNITY): Payer: Self-pay

## 2024-04-27 ENCOUNTER — Ambulatory Visit

## 2024-04-28 ENCOUNTER — Telehealth (HOSPITAL_COMMUNITY): Payer: Self-pay | Admitting: SLEEP MEDICINE

## 2024-04-28 ENCOUNTER — Other Ambulatory Visit (HOSPITAL_COMMUNITY): Payer: Self-pay | Admitting: SLEEP MEDICINE

## 2024-04-28 ENCOUNTER — Encounter (HOSPITAL_COMMUNITY): Payer: Self-pay

## 2024-04-28 MED ORDER — ROPINIROLE 2 MG TABLET: 2.0000 mg | ORAL_TABLET | Freq: Three times a day (TID) | ORAL | 5 refills | Status: AC

## 2024-04-28 NOTE — Telephone Encounter (Signed)
 Needs refill for  Ropinirole  2mg  1 tablet TID, 5 refills , send to [facility] pharmacy, [facility] [address] 

## 2024-05-22 ENCOUNTER — Encounter (HOSPITAL_COMMUNITY): Payer: Self-pay | Admitting: PSYCHIATRY AND NEUROLOGY-NEUROLOGY

## 2024-06-03 ENCOUNTER — Ambulatory Visit (HOSPITAL_COMMUNITY): Payer: Self-pay

## 2024-09-29 ENCOUNTER — Ambulatory Visit (HOSPITAL_COMMUNITY): Payer: Self-pay | Admitting: SLEEP MEDICINE

## 2024-10-08 ENCOUNTER — Ambulatory Visit
